# Patient Record
Sex: Female | Born: 1950 | Race: White | Hispanic: No | Marital: Married | Smoking: Former smoker
Health system: Southern US, Community
[De-identification: ages and names within clinical notes are randomized; demographics above are authoritative.]

## PROBLEM LIST (undated history)

## (undated) DIAGNOSIS — R9409 Abnormal results of other function studies of central nervous system: Secondary | ICD-10-CM

## (undated) DIAGNOSIS — M1990 Unspecified osteoarthritis, unspecified site: Secondary | ICD-10-CM

## (undated) DIAGNOSIS — A879 Viral meningitis, unspecified: Secondary | ICD-10-CM

## (undated) DIAGNOSIS — E079 Disorder of thyroid, unspecified: Secondary | ICD-10-CM

## (undated) DIAGNOSIS — E039 Hypothyroidism, unspecified: Secondary | ICD-10-CM

## (undated) DIAGNOSIS — K219 Gastro-esophageal reflux disease without esophagitis: Secondary | ICD-10-CM

## (undated) DIAGNOSIS — B009 Herpesviral infection, unspecified: Secondary | ICD-10-CM

## (undated) HISTORY — PX: APPENDECTOMY: SHX54

## (undated) HISTORY — PX: COLONOSCOPY: SHX174

## (undated) HISTORY — PX: THYROIDECTOMY: SHX17

## (undated) HISTORY — PX: ESOPHAGOGASTRODUODENOSCOPY: SHX1529

---

## 2009-01-14 DIAGNOSIS — A879 Viral meningitis, unspecified: Secondary | ICD-10-CM

## 2009-01-14 HISTORY — DX: Viral meningitis, unspecified: A87.9

## 2013-11-16 ENCOUNTER — Emergency Department (HOSPITAL_BASED_OUTPATIENT_CLINIC_OR_DEPARTMENT_OTHER)
Admission: EM | Admit: 2013-11-16 | Discharge: 2013-11-16 | Disposition: A | Discharge status: Home / Self Care | Payer: BC Managed Care – PPO | Attending: Emergency Medicine | Admitting: Emergency Medicine

## 2013-11-16 ENCOUNTER — Encounter (HOSPITAL_BASED_OUTPATIENT_CLINIC_OR_DEPARTMENT_OTHER): Payer: Self-pay

## 2013-11-16 DIAGNOSIS — Z91040 Latex allergy status: Secondary | ICD-10-CM | POA: Insufficient documentation

## 2013-11-16 DIAGNOSIS — R140 Abdominal distension (gaseous): Secondary | ICD-10-CM

## 2013-11-16 DIAGNOSIS — Z9889 Other specified postprocedural states: Secondary | ICD-10-CM | POA: Diagnosis not present

## 2013-11-16 DIAGNOSIS — E079 Disorder of thyroid, unspecified: Secondary | ICD-10-CM | POA: Insufficient documentation

## 2013-11-16 DIAGNOSIS — K921 Melena: Secondary | ICD-10-CM | POA: Diagnosis not present

## 2013-11-16 DIAGNOSIS — K219 Gastro-esophageal reflux disease without esophagitis: Secondary | ICD-10-CM | POA: Diagnosis not present

## 2013-11-16 DIAGNOSIS — K625 Hemorrhage of anus and rectum: Secondary | ICD-10-CM | POA: Diagnosis present

## 2013-11-16 DIAGNOSIS — Z9089 Acquired absence of other organs: Secondary | ICD-10-CM | POA: Insufficient documentation

## 2013-11-16 DIAGNOSIS — M549 Dorsalgia, unspecified: Secondary | ICD-10-CM | POA: Insufficient documentation

## 2013-11-16 HISTORY — DX: Gastro-esophageal reflux disease without esophagitis: K21.9

## 2013-11-16 HISTORY — DX: Disorder of thyroid, unspecified: E07.9

## 2013-11-16 HISTORY — DX: Abnormal results of other function studies of central nervous system: R94.09

## 2013-11-16 LAB — CBC WITH DIFFERENTIAL/PLATELET
Basophils Absolute: 0.1 10*3/uL (ref 0.0–0.1)
Basophils Relative: 1 % (ref 0–1)
EOS PCT: 0 % (ref 0–5)
Eosinophils Absolute: 0 10*3/uL (ref 0.0–0.7)
HCT: 41.1 % (ref 36.0–46.0)
Hemoglobin: 14.1 g/dL (ref 12.0–15.0)
Lymphocytes Relative: 37 % (ref 12–46)
Lymphs Abs: 1.9 10*3/uL (ref 0.7–4.0)
MCH: 29.7 pg (ref 26.0–34.0)
MCHC: 34.3 g/dL (ref 30.0–36.0)
MCV: 86.7 fL (ref 78.0–100.0)
MONO ABS: 0.9 10*3/uL (ref 0.1–1.0)
Monocytes Relative: 17 % — ABNORMAL HIGH (ref 3–12)
NEUTROS PCT: 45 % (ref 43–77)
Neutro Abs: 2.1 10*3/uL (ref 1.7–7.7)
Platelets: 192 10*3/uL (ref 150–400)
RBC: 4.74 MIL/uL (ref 3.87–5.11)
RDW: 13.6 % (ref 11.5–15.5)
WBC: 5 10*3/uL (ref 4.0–10.5)

## 2013-11-16 LAB — BASIC METABOLIC PANEL
Anion gap: 12 (ref 5–15)
BUN: 17 mg/dL (ref 6–23)
CHLORIDE: 102 meq/L (ref 96–112)
CO2: 28 mEq/L (ref 19–32)
Calcium: 10 mg/dL (ref 8.4–10.5)
Creatinine, Ser: 0.8 mg/dL (ref 0.50–1.10)
GFR, EST NON AFRICAN AMERICAN: 78 mL/min — AB (ref >90)
Glucose, Bld: 97 mg/dL (ref 70–99)
POTASSIUM: 4.3 meq/L (ref 3.7–5.3)
Sodium: 142 mEq/L (ref 137–147)

## 2013-11-16 LAB — OCCULT BLOOD X 1 CARD TO LAB, STOOL: Fecal Occult Bld: NEGATIVE

## 2013-11-16 NOTE — Discharge Instructions (Signed)
Your test for blood in your stool is negative. Your blood work does not show signs of infection and no anemia. Stay on a bland diet for the next few days and follow up with your doctor. Return here as needed for worsening symptoms.

## 2013-11-16 NOTE — ED Notes (Signed)
2 day hx diffuse abdominal pain and dark, tarry stools.

## 2013-11-16 NOTE — ED Provider Notes (Signed)
CSN: [id number]     Arrival date & time 11/16/13  1243 History   First MD Initiated Contact with Patient 11/16/13 1337     Chief Complaint  Patient presents with  . Rectal Bleeding     (Consider location/radiation/quality/duration/timing/severity/associated sxs/prior Treatment) Patient is a 63 y.o. female presenting with hematochezia. The history is provided by the patient.  Rectal Bleeding Quality:  Black and tarry Duration:  2 days Timing:  Intermittent Progression:  Worsening Chronicity:  New Context: not constipation, not diarrhea, not hemorrhoids and not rectal pain   Similar prior episodes: no   Relieved by:  None tried Worsened by:  Nothing tried Ineffective treatments:  None tried Associated symptoms: abdominal pain   Associated symptoms: no fever and no vomiting    Terri Thomas is a 63 y.o. female who presents to the ED with soft stools that have gotten darker over the past 2 days with abdominal bloating. She rates the pain as 1/10. She had a colonoscopy 3 years ago and endoscopy one year ago.   Past Medical History  Diagnosis Date  . GERD (gastroesophageal reflux disease)   . Other abnormality of brain or central nervous system function study     Goblet cell metaplasia  . Thyroid disease    Past Surgical History  Procedure Laterality Date  . Appendectomy    . Thyroidectomy    . Colonoscopy    . Esophagogastroduodenoscopy     No family history on file. History  Substance Use Topics  . Smoking status: Never Smoker   . Smokeless tobacco: Not on file  . Alcohol Use: 0.6 oz/week    1 Glasses of wine per week     Comment: daily   OB History    No data available     Review of Systems  Constitutional: Negative for fever and chills.  HENT: Negative.   Eyes: Negative for pain, redness, itching and visual disturbance.  Respiratory: Negative for cough, shortness of breath and wheezing.   Cardiovascular: Negative for chest pain and palpitations.   Gastrointestinal: Positive for abdominal pain and hematochezia. Negative for vomiting, diarrhea and constipation.  Genitourinary: Negative for dysuria, urgency, frequency, hematuria, flank pain, vaginal bleeding and vaginal discharge.  Musculoskeletal: Positive for back pain. Negative for myalgias.  Skin: Negative for rash.  Allergic/Immunologic: Negative for environmental allergies and food allergies.  Neurological: Negative for syncope and headaches.  Psychiatric/Behavioral: The patient is not nervous/anxious.       Allergies  Contrast media; Erythromycin; Latex; and Shellfish allergy  Home Medications   Prior to Admission medications   Medication Sig Start Date End Date Taking? Authorizing Provider  Levothyroxine Sodium (SYNTHROID PO) Take by mouth.   Yes Historical Provider, MD  Omeprazole (PRILOSEC PO) Take by mouth.   Yes Historical Provider, MD   BP 119/70 mmHg  Pulse 60  Temp(Src) 98.1 F (36.7 C) (Oral)  Resp 18  Ht 5\' 8"  (1.727 m)  Wt 195 lb (88.451 kg)  BMI 29.66 kg/m2  SpO2 99% Physical Exam  Constitutional: She is oriented to person, place, and time. She appears well-developed and well-nourished.  HENT:  Head: Normocephalic and atraumatic.  Eyes: EOM are normal.  Neck: Neck supple.  Cardiovascular: Normal rate.   Pulmonary/Chest: Effort normal.  Abdominal: Soft. Bowel sounds are normal. There is no tenderness.  Genitourinary: Rectal exam shows no external hemorrhoid, no internal hemorrhoid, no fissure, no mass, no tenderness and anal tone normal. Guaiac negative stool.  Soft black stool that is guaiac  negative.   Musculoskeletal: Normal range of motion.  Neurological: She is alert and oriented to person, place, and time. No cranial nerve deficit.  Skin: Skin is warm and dry.  Psychiatric: She has a normal mood and affect. Her behavior is normal.  Nursing note and vitals reviewed.   ED Course  Procedures (including critical care time) Labs  Review Results for orders placed or performed during the hospital encounter of 11/16/13 (from the past 24 hour(s))  Occult blood card to lab, stool Provider will collect     Status: None   Collection Time: 11/16/13  2:00 PM  Result Value Ref Range   Fecal Occult Bld NEGATIVE NEGATIVE  CBC with Differential     Status: Abnormal   Collection Time: 11/16/13  2:05 PM  Result Value Ref Range   WBC 5.0 4.0 - 10.5 K/uL   RBC 4.74 3.87 - 5.11 MIL/uL   Hemoglobin 14.1 12.0 - 15.0 g/dL   HCT 41.1 36.0 - 46.0 %   MCV 86.7 78.0 - 100.0 fL   MCH 29.7 26.0 - 34.0 pg   MCHC 34.3 30.0 - 36.0 g/dL   RDW 13.6 11.5 - 15.5 %   Platelets 192 150 - 400 K/uL   Neutrophils Relative % 45 43 - 77 %   Lymphocytes Relative 37 12 - 46 %   Monocytes Relative 17 (H) 3 - 12 %   Eosinophils Relative 0 0 - 5 %   Basophils Relative 1 0 - 1 %   Neutro Abs 2.1 1.7 - 7.7 K/uL   Lymphs Abs 1.9 0.7 - 4.0 K/uL   Monocytes Absolute 0.9 0.1 - 1.0 K/uL   Eosinophils Absolute 0.0 0.0 - 0.7 K/uL   Basophils Absolute 0.1 0.0 - 0.1 K/uL   RBC Morphology STOMATOCYTES    WBC Morphology WHITE COUNT CONFIRMED ON SMEAR    Smear Review PLATELET COUNT CONFIRMED BY SMEAR   Basic metabolic panel     Status: Abnormal   Collection Time: 11/16/13  2:05 PM  Result Value Ref Range   Sodium 142 137 - 147 mEq/L   Potassium 4.3 3.7 - 5.3 mEq/L   Chloride 102 96 - 112 mEq/L   CO2 28 19 - 32 mEq/L   Glucose, Bld 97 70 - 99 mg/dL   BUN 17 6 - 23 mg/dL   Creatinine, Ser 0.80 0.50 - 1.10 mg/dL   Calcium 10.0 8.4 - 10.5 mg/dL   GFR calc non Af Amer 78 (L) >90 mL/min   GFR calc Af Amer >90 >90 mL/min   Anion gap 12 5 - 15     MDM  63 y.o. female with black stools x 2 days that concerned her for possible blood in her stools. I have reviewed this patient's vital signs, nurses notes, appropriate labs and discussed findings with the patient and plan of care. She voices understanding and agrees with plan. She will follow up with her PCP. She  will return as needed. Stable for discharge without blood identified in stool. [facility] [address] [place], [place] 11/18/13 [place], MD 11/20/13 [phone]

## 2014-10-17 ENCOUNTER — Other Ambulatory Visit (HOSPITAL_COMMUNITY): Payer: Self-pay | Admitting: Chiropractic Medicine

## 2014-10-17 ENCOUNTER — Ambulatory Visit (HOSPITAL_COMMUNITY)
Admission: RE | Admit: 2014-10-17 | Discharge: 2014-10-17 | Disposition: A | Discharge status: Home / Self Care | Payer: 59 | Source: Ambulatory Visit | Attending: Chiropractic Medicine | Admitting: Chiropractic Medicine

## 2014-10-17 DIAGNOSIS — M25551 Pain in right hip: Secondary | ICD-10-CM | POA: Diagnosis present

## 2014-10-17 DIAGNOSIS — M461 Sacroiliitis, not elsewhere classified: Secondary | ICD-10-CM | POA: Insufficient documentation

## 2015-03-20 DIAGNOSIS — K2100 Gastro-esophageal reflux disease with esophagitis, without bleeding: Secondary | ICD-10-CM | POA: Diagnosis present

## 2015-03-21 DIAGNOSIS — K648 Other hemorrhoids: Secondary | ICD-10-CM | POA: Diagnosis present

## 2015-06-19 DIAGNOSIS — B009 Herpesviral infection, unspecified: Secondary | ICD-10-CM | POA: Diagnosis present

## 2015-11-08 DIAGNOSIS — E890 Postprocedural hypothyroidism: Secondary | ICD-10-CM | POA: Diagnosis present

## 2015-12-15 HISTORY — PX: MENISCUS REPAIR: SHX5179

## 2016-09-02 DIAGNOSIS — M1611 Unilateral primary osteoarthritis, right hip: Secondary | ICD-10-CM | POA: Diagnosis present

## 2016-09-02 NOTE — H&P (Signed)
PREOPERATIVE H&P Patient ID: Terri Thomas MRN: [id number] DOB/AGE: Jul 06, 1950 66 y.o.  Chief Complaint: OA RIGHT HIP  Planned Procedure Date: 10/01/16 Medical and Cardiac Clearance by Antonieta Pert, FNP    HPI: Terri Thomas is a 66 y.o. female with a history of GERD, and post-surgical hypothyroidism who presents for evaluation of OA RIGHT HIP. The patient has a history of pain and functional disability in the right hip due to arthritis and has failed non-surgical conservative treatments for greater than 12 weeks to include NSAID's and/or analgesics, weight reduction as appropriate and activity modification.  Onset of symptoms was gradual, starting 5 years ago with gradually worsening course since that time. The patient noted no past surgery on the right hip.  Patient currently rates pain at 8 out of 10 with activity. Patient has night pain, worsening of pain with activity and weight bearing, pain that interferes with activities of daily living and pain with passive range of motion.  Patient has evidence of subchondral cysts, subchondral sclerosis, periarticular osteophytes and joint space narrowing by imaging studies.  There is no active infection.  Past Medical History:  Diagnosis Date  . GERD (gastroesophageal reflux disease)   . Other abnormality of brain or central nervous system function study    Goblet cell metaplasia  . Thyroid disease    Past Surgical History:  Procedure Laterality Date  . APPENDECTOMY    . COLONOSCOPY    . ESOPHAGOGASTRODUODENOSCOPY    . THYROIDECTOMY     Allergies  Allergen Reactions  . Contrast Media [Iodinated Diagnostic Agents]   . Erythromycin   . Latex   . Shellfish Allergy     Medications: Synthroid 170mcg daily Valcyclovir 500 mg daily  Ranitadine 150 mg BID Multivitamin daily  Social History: Married.  Former smoker, quit 1990 (1ppd).  1 glass wine daily.  Planning to  retire from [facility] without return postoperatively.   Family  History: Mother with Htn, DM, CVA.  Father with Cancer.  Brother with DM.  Grandparents with CV dz, DM, CVA.   ROS: Currently denies lightheadedness, dizziness, Fever, chills, CP, SOB.   No personal history of DVT, PE, MI, or CVA. No loose teeth or dentures All other systems have been reviewed and were otherwise currently negative with the exception of those mentioned in the HPI and as above.  Objective: Vitals: Ht: 5'8" Wt: 195 Temp: 97.9 BP: 135/81 Pulse: 63 O2 99% on room air. Physical Exam: General: Alert, NAD. Trendelenberg Gait  HEENT: EOMI, Good Neck Extension  Pulm: No increased work of breathing.  Clear B/L A/P w/o crackle or wheeze.  CV: RRR, No m/g/r appreciated  GI: soft, NT, ND Neuro: Neuro without gross focal deficit.  Sensation intact distally Skin: No lesions in the area of chief complaint MSK/Surgical Site: Right Hip Pain with passive ROM.  Positive Stinchfield.  5/5 strength.  NVI.  Sensation intact distally.  Imaging Review Plain radiographs demonstrate severe degenerative joint disease of the right hip.   Assessment: OA RIGHT HIP Principal Problem:   Primary osteoarthritis of right hip Active Problems:   Gastroesophageal reflux disease with esophagitis   Herpes simplex type 2 infection   Internal hemorrhoids   Postoperative hypothyroidism   Plan: Plan for Procedure(s): TOTAL HIP ARTHROPLASTY ANTERIOR APPROACH  The patient history, physical exam, clinical judgement of the provider and imaging are consistent with end stage degenerative joint disease and total joint arthroplasty is deemed medically necessary. The treatment options including medical management, injection therapy, and arthroplasty were discussed  at length. The risks and benefits of Procedure(s): TOTAL HIP ARTHROPLASTY ANTERIOR APPROACH were presented and reviewed.  The risks of nonoperative treatment, versus surgical intervention including but not limited to continued pain, aseptic loosening,  stiffness, dislocation/subluxation, infection, bleeding, nerve injury, blood clots, cardiopulmonary complications, morbidity, mortality, among others were discussed. The patient verbalizes understanding and wishes to proceed with the plan.  Patient is being admitted for inpatient treatment for surgery, pain control, PT, OT, prophylactic antibiotics, VTE prophylaxis, progressive ambulation, ADL's and discharge planning.   Dental prophylaxis discussed and recommended for 2 years postoperatively.   The patient does meet the criteria for TXA which will be used perioperatively via IV.    ASA 325 mg will be used postoperatively for DVT prophylaxis in addition to SCDs, and early ambulation.  The patient is planning to be discharged home with home health services ([facility]) in care of her husband Terri Thomas.  Prudencio Burly III, PA-C 09/02/2016 4:07 PM

## 2016-09-17 ENCOUNTER — Encounter (HOSPITAL_COMMUNITY): Payer: Self-pay

## 2016-09-17 ENCOUNTER — Encounter (HOSPITAL_COMMUNITY)
Admission: RE | Admit: 2016-09-17 | Discharge: 2016-09-17 | Disposition: A | Discharge status: Home / Self Care | Payer: 59 | Source: Ambulatory Visit | Attending: Orthopedic Surgery | Admitting: Orthopedic Surgery

## 2016-09-17 DIAGNOSIS — Z833 Family history of diabetes mellitus: Secondary | ICD-10-CM | POA: Insufficient documentation

## 2016-09-17 DIAGNOSIS — Z91040 Latex allergy status: Secondary | ICD-10-CM | POA: Diagnosis not present

## 2016-09-17 DIAGNOSIS — M1611 Unilateral primary osteoarthritis, right hip: Secondary | ICD-10-CM | POA: Insufficient documentation

## 2016-09-17 DIAGNOSIS — E890 Postprocedural hypothyroidism: Secondary | ICD-10-CM | POA: Insufficient documentation

## 2016-09-17 DIAGNOSIS — K219 Gastro-esophageal reflux disease without esophagitis: Secondary | ICD-10-CM | POA: Insufficient documentation

## 2016-09-17 DIAGNOSIS — Z9889 Other specified postprocedural states: Secondary | ICD-10-CM | POA: Diagnosis not present

## 2016-09-17 DIAGNOSIS — Z01812 Encounter for preprocedural laboratory examination: Secondary | ICD-10-CM | POA: Insufficient documentation

## 2016-09-17 DIAGNOSIS — Z91013 Allergy to seafood: Secondary | ICD-10-CM | POA: Insufficient documentation

## 2016-09-17 DIAGNOSIS — Z8249 Family history of ischemic heart disease and other diseases of the circulatory system: Secondary | ICD-10-CM | POA: Diagnosis not present

## 2016-09-17 DIAGNOSIS — Z809 Family history of malignant neoplasm, unspecified: Secondary | ICD-10-CM | POA: Insufficient documentation

## 2016-09-17 DIAGNOSIS — Z91041 Radiographic dye allergy status: Secondary | ICD-10-CM | POA: Insufficient documentation

## 2016-09-17 DIAGNOSIS — Z79899 Other long term (current) drug therapy: Secondary | ICD-10-CM | POA: Insufficient documentation

## 2016-09-17 DIAGNOSIS — Z888 Allergy status to other drugs, medicaments and biological substances status: Secondary | ICD-10-CM | POA: Insufficient documentation

## 2016-09-17 DIAGNOSIS — Z87891 Personal history of nicotine dependence: Secondary | ICD-10-CM | POA: Diagnosis not present

## 2016-09-17 DIAGNOSIS — Z823 Family history of stroke: Secondary | ICD-10-CM | POA: Diagnosis not present

## 2016-09-17 HISTORY — DX: Viral meningitis, unspecified: A87.9

## 2016-09-17 HISTORY — DX: Hypothyroidism, unspecified: E03.9

## 2016-09-17 HISTORY — DX: Herpesviral infection, unspecified: B00.9

## 2016-09-17 HISTORY — DX: Unspecified osteoarthritis, unspecified site: M19.90

## 2016-09-17 LAB — SURGICAL PCR SCREEN
MRSA, PCR: NEGATIVE
STAPHYLOCOCCUS AUREUS: NEGATIVE

## 2016-09-17 LAB — CBC
HEMATOCRIT: 38.7 % (ref 36.0–46.0)
Hemoglobin: 13.1 g/dL (ref 12.0–15.0)
MCH: 29.2 pg (ref 26.0–34.0)
MCHC: 33.9 g/dL (ref 30.0–36.0)
MCV: 86.2 fL (ref 78.0–100.0)
PLATELETS: 149 10*3/uL — AB (ref 150–400)
RBC: 4.49 MIL/uL (ref 3.87–5.11)
RDW: 13.6 % (ref 11.5–15.5)
WBC: 3.3 10*3/uL — ABNORMAL LOW (ref 4.0–10.5)

## 2016-09-17 LAB — BASIC METABOLIC PANEL
Anion gap: 11 (ref 5–15)
BUN: 25 mg/dL — AB (ref 6–20)
CO2: 23 mmol/L (ref 22–32)
Calcium: 9.7 mg/dL (ref 8.9–10.3)
Chloride: 107 mmol/L (ref 101–111)
Creatinine, Ser: 1.09 mg/dL — ABNORMAL HIGH (ref 0.44–1.00)
GFR calc Af Amer: >60 mL/min (ref >60)
GFR, EST NON AFRICAN AMERICAN: 52 mL/min — AB (ref >60)
GLUCOSE: 114 mg/dL — AB (ref 65–99)
POTASSIUM: 4 mmol/L (ref 3.5–5.1)
SODIUM: 141 mmol/L (ref 135–145)

## 2016-09-17 NOTE — Pre-Procedure Instructions (Signed)
IVETT LUEBBE  09/17/2016      [id number] - [address], [place] [address] Phone: [phone] Fax: [phone]    Your procedure is scheduled on October 01, 2016.  Report to [facility] Admitting at 5:30 A.M.  Call this number if you have problems the morning of surgery:  [phone]   Call [phone] if you have any questions prior to your surgery date Monday-Friday 8am-4pm   Remember:  Do not eat food or drink liquids after midnight.   Take these medicines the morning of surgery with A SIP OF WATER Tylenol if needed, Levothyroxine (Synthroid), Ranitidine (Zantac), Valacyclovir (Valtrex)  7 days prior to surgery STOP taking any Aspirin, Aleve, Naproxen, Ibuprofen, Motrin, Advil, Goody's, BC's, all herbal medications, fish oil, and all vitamins    Do not wear jewelry, make-up or nail polish.  Do not wear lotions, powders, or perfumes, or deodorant.  Do not shave 48 hours prior to surgery.    Do not bring valuables to the hospital.  [facility] is not responsible for any belongings or valuables.  Contacts, dentures or bridgework may not be worn into surgery.  Leave your suitcase in the car.  After surgery it may be brought to your room.  For patients admitted to the hospital, discharge time will be determined by your treatment team.  Patients discharged the day of surgery will not be allowed to drive home.   Special instructions:   Index- Preparing For Surgery  Before surgery, you can play an important role. Because skin is not sterile, your skin needs to be as free of germs as possible. You can reduce the number of germs on your skin by washing with CHG (chlorahexidine gluconate) Soap before surgery.  CHG is an antiseptic cleaner which kills germs and bonds with the skin to continue killing germs even after washing.  Please do not use if you have an allergy to CHG or antibacterial soaps. If  your skin becomes reddened/irritated stop using the CHG.  Do not shave (including legs and underarms) for at least 48 hours prior to first CHG shower. It is OK to shave your face.  Please follow these instructions carefully.   1. Shower the NIGHT BEFORE SURGERY and the MORNING OF SURGERY with CHG.   2. If you chose to wash your hair, wash your hair first as usual with your normal shampoo.  3. After you shampoo, rinse your hair and body thoroughly to remove the shampoo.  4. Use CHG as you would any other liquid soap. You can apply CHG directly to the skin and wash gently with a scrungie or a clean washcloth.   5. Apply the CHG Soap to your body ONLY FROM THE NECK DOWN.  Do not use on open wounds or open sores. Avoid contact with your eyes, ears, mouth and genitals (private parts). Wash genitals (private parts) with your normal soap.  6. Wash thoroughly, paying special attention to the area where your surgery will be performed.  7. Thoroughly rinse your body with warm water from the neck down.  8. DO NOT shower/wash with your normal soap after using and rinsing off the CHG Soap.  9. Pat yourself dry with a CLEAN TOWEL.   10. Wear CLEAN PAJAMAS   11. Place CLEAN SHEETS on your bed the night of your first shower and DO NOT SLEEP WITH PETS.    Day of Surgery: Do not apply any  deodorants/lotions. Please wear clean clothes to the hospital/surgery center.      Please read over the following fact sheets that you were given. Coughing and Deep Breathing, MRSA Information and Surgical Site Infection Prevention

## 2016-09-17 NOTE — Progress Notes (Signed)
PCP: Antonieta Pert FNP-[facility]  Cardiologist: Denies  EKG: 07/31/16 in care everywhere, requested from [facility] CXR, ECHO, Stress Test, Cardiac Cath: Denies  Patient denies shortness of breath, fever, cough, and chest pain at PAT appointment.  Patient verbalized understanding of instructions provided today at the PAT appointment.  Patient asked to review instructions at home and day of surgery.

## 2016-09-20 ENCOUNTER — Other Ambulatory Visit (HOSPITAL_COMMUNITY): Payer: 59

## 2016-09-30 MED ORDER — TRANEXAMIC ACID 1000 MG/10ML IV SOLN
2000.0000 mg | Freq: Once | INTRAVENOUS | Status: AC
  Administered 2016-10-01: 2000 mg via TOPICAL
  Filled 2016-09-30: qty 20

## 2016-09-30 MED ORDER — TRANEXAMIC ACID 1000 MG/10ML IV SOLN
1000.0000 mg | INTRAVENOUS | Status: AC
  Administered 2016-10-01: 1000 mg via INTRAVENOUS
  Filled 2016-09-30: qty 10

## 2016-10-01 ENCOUNTER — Inpatient Hospital Stay (HOSPITAL_COMMUNITY): Payer: 59 | Admitting: Anesthesiology

## 2016-10-01 ENCOUNTER — Inpatient Hospital Stay (HOSPITAL_COMMUNITY): Payer: 59

## 2016-10-01 ENCOUNTER — Encounter (HOSPITAL_COMMUNITY)
Admission: RE | Disposition: A | Discharge status: Home Health | Payer: Self-pay | Source: Ambulatory Visit | Attending: Orthopedic Surgery

## 2016-10-01 ENCOUNTER — Encounter (HOSPITAL_COMMUNITY): Payer: Self-pay | Admitting: General Practice

## 2016-10-01 ENCOUNTER — Inpatient Hospital Stay (HOSPITAL_COMMUNITY)
Admission: RE | Admit: 2016-10-01 | Discharge: 2016-10-02 | DRG: 470 | Disposition: A | Discharge status: Home Health | Payer: 59 | Source: Ambulatory Visit | Attending: Orthopedic Surgery | Admitting: Orthopedic Surgery

## 2016-10-01 DIAGNOSIS — K2100 Gastro-esophageal reflux disease with esophagitis, without bleeding: Secondary | ICD-10-CM | POA: Diagnosis present

## 2016-10-01 DIAGNOSIS — Z87891 Personal history of nicotine dependence: Secondary | ICD-10-CM

## 2016-10-01 DIAGNOSIS — Z6829 Body mass index (BMI) 29.0-29.9, adult: Secondary | ICD-10-CM | POA: Diagnosis not present

## 2016-10-01 DIAGNOSIS — Z79899 Other long term (current) drug therapy: Secondary | ICD-10-CM | POA: Diagnosis not present

## 2016-10-01 DIAGNOSIS — B009 Herpesviral infection, unspecified: Secondary | ICD-10-CM | POA: Diagnosis present

## 2016-10-01 DIAGNOSIS — E669 Obesity, unspecified: Secondary | ICD-10-CM | POA: Diagnosis present

## 2016-10-01 DIAGNOSIS — K648 Other hemorrhoids: Secondary | ICD-10-CM | POA: Diagnosis present

## 2016-10-01 DIAGNOSIS — Z91040 Latex allergy status: Secondary | ICD-10-CM | POA: Diagnosis not present

## 2016-10-01 DIAGNOSIS — Z91041 Radiographic dye allergy status: Secondary | ICD-10-CM

## 2016-10-01 DIAGNOSIS — M1611 Unilateral primary osteoarthritis, right hip: Principal | ICD-10-CM | POA: Diagnosis present

## 2016-10-01 DIAGNOSIS — Z881 Allergy status to other antibiotic agents status: Secondary | ICD-10-CM | POA: Diagnosis not present

## 2016-10-01 DIAGNOSIS — Z8661 Personal history of infections of the central nervous system: Secondary | ICD-10-CM

## 2016-10-01 DIAGNOSIS — E890 Postprocedural hypothyroidism: Secondary | ICD-10-CM | POA: Diagnosis present

## 2016-10-01 DIAGNOSIS — Z419 Encounter for procedure for purposes other than remedying health state, unspecified: Secondary | ICD-10-CM

## 2016-10-01 HISTORY — PX: TOTAL HIP ARTHROPLASTY: SHX124

## 2016-10-01 SURGERY — ARTHROPLASTY, HIP, TOTAL, ANTERIOR APPROACH
Anesthesia: Spinal | Site: Hip | Laterality: Right

## 2016-10-01 MED ORDER — KETOROLAC TROMETHAMINE 30 MG/ML IJ SOLN
INTRAMUSCULAR | Status: AC
  Filled 2016-10-01: qty 3

## 2016-10-01 MED ORDER — MENTHOL 3 MG MT LOZG: 1.0000 | LOZENGE | OROMUCOSAL | Status: DC | PRN

## 2016-10-01 MED ORDER — PHENYLEPHRINE 40 MCG/ML (10ML) SYRINGE FOR IV PUSH (FOR BLOOD PRESSURE SUPPORT)
PREFILLED_SYRINGE | INTRAVENOUS | Status: AC
  Filled 2016-10-01: qty 10

## 2016-10-01 MED ORDER — ASPIRIN EC 325 MG PO TBEC
325.0000 mg | DELAYED_RELEASE_TABLET | Freq: Every day | ORAL | Status: DC
  Administered 2016-10-02: 325 mg via ORAL
  Filled 2016-10-01: qty 1

## 2016-10-01 MED ORDER — LEVOTHYROXINE SODIUM 25 MCG PO TABS
137.0000 ug | ORAL_TABLET | Freq: Every day | ORAL | Status: DC
  Administered 2016-10-02: 137 ug via ORAL
  Filled 2016-10-01: qty 1

## 2016-10-01 MED ORDER — CELECOXIB 200 MG PO CAPS
200.0000 mg | ORAL_CAPSULE | Freq: Two times a day (BID) | ORAL | Status: DC
  Administered 2016-10-01 – 2016-10-02 (×3): 200 mg via ORAL
  Filled 2016-10-01 (×3): qty 1

## 2016-10-01 MED ORDER — CELECOXIB 200 MG PO CAPS
200.0000 mg | ORAL_CAPSULE | Freq: Two times a day (BID) | ORAL | 0 refills | Status: AC
Start: 2016-10-01 — End: 2017-10-01

## 2016-10-01 MED ORDER — ASPIRIN EC 325 MG PO TBEC: 325.0000 mg | DELAYED_RELEASE_TABLET | Freq: Every day | ORAL | 0 refills | Status: AC

## 2016-10-01 MED ORDER — DIPHENHYDRAMINE HCL 12.5 MG/5ML PO ELIX: 12.5000 mg | ORAL_SOLUTION | ORAL | Status: DC | PRN

## 2016-10-01 MED ORDER — METHOCARBAMOL 500 MG PO TABS: 500.0000 mg | ORAL_TABLET | Freq: Four times a day (QID) | ORAL | 0 refills | Status: AC | PRN

## 2016-10-01 MED ORDER — ACETAMINOPHEN 500 MG PO TABS
1000.0000 mg | ORAL_TABLET | Freq: Once | ORAL | Status: DC
  Filled 2016-10-01: qty 2

## 2016-10-01 MED ORDER — DEXAMETHASONE SODIUM PHOSPHATE 10 MG/ML IJ SOLN
10.0000 mg | Freq: Once | INTRAMUSCULAR | Status: AC
  Administered 2016-10-02: 10 mg via INTRAVENOUS
  Filled 2016-10-01: qty 1

## 2016-10-01 MED ORDER — FAMOTIDINE 20 MG PO TABS
20.0000 mg | ORAL_TABLET | Freq: Every day | ORAL | Status: DC
  Administered 2016-10-02: 20 mg via ORAL
  Filled 2016-10-01: qty 1

## 2016-10-01 MED ORDER — METHOCARBAMOL 500 MG PO TABS
500.0000 mg | ORAL_TABLET | Freq: Four times a day (QID) | ORAL | Status: DC | PRN
  Administered 2016-10-01 – 2016-10-02 (×3): 500 mg via ORAL
  Filled 2016-10-01 (×2): qty 1

## 2016-10-01 MED ORDER — MIDAZOLAM HCL 5 MG/5ML IJ SOLN
INTRAMUSCULAR | Status: DC | PRN
  Administered 2016-10-01: 2 mg via INTRAVENOUS

## 2016-10-01 MED ORDER — PROPOFOL 10 MG/ML IV BOLUS
INTRAVENOUS | Status: AC
  Filled 2016-10-01: qty 40

## 2016-10-01 MED ORDER — BUPIVACAINE IN DEXTROSE 0.75-8.25 % IT SOLN
INTRATHECAL | Status: DC | PRN
  Administered 2016-10-01: 2 mL via INTRATHECAL

## 2016-10-01 MED ORDER — HYDROCODONE-ACETAMINOPHEN 5-325 MG PO TABS: 1.0000 | ORAL_TABLET | ORAL | 0 refills | Status: AC | PRN

## 2016-10-01 MED ORDER — FLEET ENEMA 7-19 GM/118ML RE ENEM: 1.0000 | ENEMA | Freq: Once | RECTAL | Status: DC | PRN

## 2016-10-01 MED ORDER — DOCUSATE SODIUM 100 MG PO CAPS
100.0000 mg | ORAL_CAPSULE | Freq: Two times a day (BID) | ORAL | Status: DC
  Administered 2016-10-01 – 2016-10-02 (×3): 100 mg via ORAL
  Filled 2016-10-01 (×3): qty 1

## 2016-10-01 MED ORDER — BUPIVACAINE-EPINEPHRINE 0.25% -1:200000 IJ SOLN
INTRAMUSCULAR | Status: DC | PRN
  Administered 2016-10-01: 30 mL

## 2016-10-01 MED ORDER — SODIUM CHLORIDE FLUSH 0.9 % IV SOLN
INTRAVENOUS | Status: DC | PRN
  Administered 2016-10-01 (×3): 10 mL

## 2016-10-01 MED ORDER — LACTATED RINGERS IV SOLN
INTRAVENOUS | Status: DC
  Administered 2016-10-01 (×2): via INTRAVENOUS

## 2016-10-01 MED ORDER — PROPOFOL 10 MG/ML IV BOLUS
INTRAVENOUS | Status: DC | PRN
  Administered 2016-10-01: 20 mg via INTRAVENOUS

## 2016-10-01 MED ORDER — FENTANYL CITRATE (PF) 100 MCG/2ML IJ SOLN
INTRAMUSCULAR | Status: AC
  Administered 2016-10-01: 50 ug via INTRAVENOUS
  Filled 2016-10-01: qty 2

## 2016-10-01 MED ORDER — ACETAMINOPHEN 325 MG PO TABS: 650.0000 mg | ORAL_TABLET | Freq: Four times a day (QID) | ORAL | Status: DC | PRN

## 2016-10-01 MED ORDER — FENTANYL CITRATE (PF) 100 MCG/2ML IJ SOLN
INTRAMUSCULAR | Status: DC | PRN
  Administered 2016-10-01: 100 ug via INTRAVENOUS

## 2016-10-01 MED ORDER — HYDROMORPHONE HCL 1 MG/ML IJ SOLN
0.5000 mg | INTRAMUSCULAR | Status: DC | PRN
  Administered 2016-10-01 – 2016-10-02 (×2): 1 mg via INTRAVENOUS
  Filled 2016-10-01 (×2): qty 1

## 2016-10-01 MED ORDER — METHOCARBAMOL 1000 MG/10ML IJ SOLN
500.0000 mg | Freq: Four times a day (QID) | INTRAVENOUS | Status: DC | PRN
  Filled 2016-10-01: qty 5

## 2016-10-01 MED ORDER — FENTANYL CITRATE (PF) 100 MCG/2ML IJ SOLN
25.0000 ug | INTRAMUSCULAR | Status: DC | PRN
  Administered 2016-10-01 (×2): 50 ug via INTRAVENOUS

## 2016-10-01 MED ORDER — CHLORHEXIDINE GLUCONATE 4 % EX LIQD: 60.0000 mL | Freq: Once | CUTANEOUS | Status: DC

## 2016-10-01 MED ORDER — 0.9 % SODIUM CHLORIDE (POUR BTL) OPTIME
TOPICAL | Status: DC | PRN
  Administered 2016-10-01: 1000 mL

## 2016-10-01 MED ORDER — METOCLOPRAMIDE HCL 5 MG/ML IJ SOLN: 5.0000 mg | Freq: Three times a day (TID) | INTRAMUSCULAR | Status: DC | PRN

## 2016-10-01 MED ORDER — POLYETHYLENE GLYCOL 3350 17 G PO PACK: 17.0000 g | PACK | Freq: Every day | ORAL | Status: DC | PRN

## 2016-10-01 MED ORDER — PHENYLEPHRINE HCL 10 MG/ML IJ SOLN
INTRAMUSCULAR | Status: DC | PRN
  Administered 2016-10-01: 120 ug via INTRAVENOUS
  Administered 2016-10-01: 80 ug via INTRAVENOUS
  Administered 2016-10-01: 120 ug via INTRAVENOUS
  Administered 2016-10-01: 80 ug via INTRAVENOUS

## 2016-10-01 MED ORDER — METHOCARBAMOL 500 MG PO TABS
ORAL_TABLET | ORAL | Status: AC
  Filled 2016-10-01: qty 1

## 2016-10-01 MED ORDER — PROPOFOL 500 MG/50ML IV EMUL
INTRAVENOUS | Status: DC | PRN
  Administered 2016-10-01: 90 ug/kg/min via INTRAVENOUS

## 2016-10-01 MED ORDER — SUCCINYLCHOLINE CHLORIDE 200 MG/10ML IV SOSY
PREFILLED_SYRINGE | INTRAVENOUS | Status: AC
  Filled 2016-10-01: qty 10

## 2016-10-01 MED ORDER — SORBITOL 70 % SOLN: 30.0000 mL | Freq: Every day | Status: DC | PRN

## 2016-10-01 MED ORDER — BUPIVACAINE-EPINEPHRINE (PF) 0.25% -1:200000 IJ SOLN
INTRAMUSCULAR | Status: AC
  Filled 2016-10-01: qty 30

## 2016-10-01 MED ORDER — ONDANSETRON HCL 4 MG PO TABS: 4.0000 mg | ORAL_TABLET | Freq: Three times a day (TID) | ORAL | 0 refills | Status: AC | PRN

## 2016-10-01 MED ORDER — GABAPENTIN 300 MG PO CAPS
300.0000 mg | ORAL_CAPSULE | Freq: Once | ORAL | Status: AC
  Administered 2016-10-01: 300 mg via ORAL
  Filled 2016-10-01: qty 1

## 2016-10-01 MED ORDER — DOCUSATE SODIUM 100 MG PO CAPS: 100.0000 mg | ORAL_CAPSULE | Freq: Two times a day (BID) | ORAL | 0 refills | Status: DC

## 2016-10-01 MED ORDER — VALACYCLOVIR HCL 500 MG PO TABS: 500.0000 mg | ORAL_TABLET | Freq: Two times a day (BID) | ORAL | Status: DC | PRN

## 2016-10-01 MED ORDER — SENNA 8.6 MG PO TABS
1.0000 | ORAL_TABLET | Freq: Two times a day (BID) | ORAL | Status: DC
  Administered 2016-10-01 – 2016-10-02 (×3): 8.6 mg via ORAL
  Filled 2016-10-01 (×3): qty 1

## 2016-10-01 MED ORDER — LACTATED RINGERS IV SOLN
INTRAVENOUS | Status: DC
  Administered 2016-10-01 (×2): via INTRAVENOUS

## 2016-10-01 MED ORDER — HYDROCODONE-ACETAMINOPHEN 5-325 MG PO TABS
1.0000 | ORAL_TABLET | ORAL | Status: DC | PRN
  Administered 2016-10-01 – 2016-10-02 (×5): 2 via ORAL
  Filled 2016-10-01 (×4): qty 2

## 2016-10-01 MED ORDER — CEFAZOLIN SODIUM-DEXTROSE 2-4 GM/100ML-% IV SOLN
2.0000 g | INTRAVENOUS | Status: AC
  Administered 2016-10-01: 2 g via INTRAVENOUS
  Filled 2016-10-01: qty 100

## 2016-10-01 MED ORDER — ONDANSETRON HCL 4 MG/2ML IJ SOLN
4.0000 mg | Freq: Four times a day (QID) | INTRAMUSCULAR | Status: DC | PRN
  Administered 2016-10-01 – 2016-10-02 (×2): 4 mg via INTRAVENOUS
  Filled 2016-10-01 (×2): qty 2

## 2016-10-01 MED ORDER — ONDANSETRON HCL 4 MG/2ML IJ SOLN: 4.0000 mg | Freq: Once | INTRAMUSCULAR | Status: DC | PRN

## 2016-10-01 MED ORDER — KETOROLAC TROMETHAMINE 30 MG/ML IJ SOLN
INTRAMUSCULAR | Status: DC | PRN
  Administered 2016-10-01: 30 mg via INTRA_ARTICULAR

## 2016-10-01 MED ORDER — LIDOCAINE 2% (20 MG/ML) 5 ML SYRINGE
INTRAMUSCULAR | Status: AC
  Filled 2016-10-01: qty 5

## 2016-10-01 MED ORDER — ONDANSETRON HCL 4 MG PO TABS: 4.0000 mg | ORAL_TABLET | Freq: Four times a day (QID) | ORAL | Status: DC | PRN

## 2016-10-01 MED ORDER — CEFAZOLIN SODIUM-DEXTROSE 2-4 GM/100ML-% IV SOLN
2.0000 g | Freq: Four times a day (QID) | INTRAVENOUS | Status: AC
  Administered 2016-10-01 (×2): 2 g via INTRAVENOUS
  Filled 2016-10-01 (×2): qty 100

## 2016-10-01 MED ORDER — FENTANYL CITRATE (PF) 250 MCG/5ML IJ SOLN
INTRAMUSCULAR | Status: AC
  Filled 2016-10-01: qty 5

## 2016-10-01 MED ORDER — MIDAZOLAM HCL 2 MG/2ML IJ SOLN
INTRAMUSCULAR | Status: AC
  Filled 2016-10-01: qty 2

## 2016-10-01 MED ORDER — HYDROCODONE-ACETAMINOPHEN 5-325 MG PO TABS
ORAL_TABLET | ORAL | Status: AC
  Filled 2016-10-01: qty 2

## 2016-10-01 MED ORDER — METOCLOPRAMIDE HCL 5 MG PO TABS: 5.0000 mg | ORAL_TABLET | Freq: Three times a day (TID) | ORAL | Status: DC | PRN

## 2016-10-01 MED ORDER — ACETAMINOPHEN 650 MG RE SUPP: 650.0000 mg | Freq: Four times a day (QID) | RECTAL | Status: DC | PRN

## 2016-10-01 MED ORDER — PHENOL 1.4 % MT LIQD: 1.0000 | OROMUCOSAL | Status: DC | PRN

## 2016-10-01 SURGICAL SUPPLY — 49 items
BAG DECANTER FOR FLEXI CONT (MISCELLANEOUS) ×2 IMPLANT
BLADE SAG 18X100X1.27 (BLADE) IMPLANT
CAPT HIP TOTAL 3 ×2 IMPLANT
CLSR STERI-STRIP ANTIMIC 1/2X4 (GAUZE/BANDAGES/DRESSINGS) ×2 IMPLANT
COVER PERINEAL POST (MISCELLANEOUS) ×2 IMPLANT
COVER SURGICAL LIGHT HANDLE (MISCELLANEOUS) ×2 IMPLANT
DRAPE C-ARM 42X72 X-RAY (DRAPES) ×2 IMPLANT
DRAPE STERI IOBAN 125X83 (DRAPES) ×2 IMPLANT
DRAPE U-SHAPE 47X51 STRL (DRAPES) IMPLANT
DRSG MEPILEX BORDER 4X8 (GAUZE/BANDAGES/DRESSINGS) ×2 IMPLANT
DURAPREP 26ML APPLICATOR (WOUND CARE) ×2 IMPLANT
ELECT BLADE 4.0 EZ CLEAN MEGAD (MISCELLANEOUS) ×2
ELECT REM PT RETURN 9FT ADLT (ELECTROSURGICAL) ×2
ELECTRODE BLDE 4.0 EZ CLN MEGD (MISCELLANEOUS) ×1 IMPLANT
ELECTRODE REM PT RTRN 9FT ADLT (ELECTROSURGICAL) ×1 IMPLANT
FACESHIELD WRAPAROUND (MASK) ×4 IMPLANT
GLOVE BIO SURGEON STRL SZ7.5 (GLOVE) ×4 IMPLANT
GLOVE BIOGEL PI IND STRL 8 (GLOVE) ×2 IMPLANT
GLOVE BIOGEL PI INDICATOR 8 (GLOVE) ×2
GOWN STRL REUS W/ TWL LRG LVL3 (GOWN DISPOSABLE) ×2 IMPLANT
GOWN STRL REUS W/TWL LRG LVL3 (GOWN DISPOSABLE) ×2
KIT BASIN OR (CUSTOM PROCEDURE TRAY) ×2 IMPLANT
KIT ROOM TURNOVER OR (KITS) ×2 IMPLANT
MANIFOLD NEPTUNE II (INSTRUMENTS) ×2 IMPLANT
NDL SAFETY ECLIPSE 18X1.5 (NEEDLE) IMPLANT
NEEDLE HYPO 18GX1.5 SHARP (NEEDLE)
NEEDLE HYPO 22GX1.5 SAFETY (NEEDLE) ×2 IMPLANT
NEEDLE SPNL 18GX3.5 QUINCKE PK (NEEDLE) ×2 IMPLANT
NS IRRIG 1000ML POUR BTL (IV SOLUTION) ×2 IMPLANT
PACK TOTAL JOINT (CUSTOM PROCEDURE TRAY) ×2 IMPLANT
PAD ARMBOARD 7.5X6 YLW CONV (MISCELLANEOUS) ×2 IMPLANT
SPONGE LAP 18X18 X RAY DECT (DISPOSABLE) IMPLANT
STRIP CLOSURE SKIN 1/2X4 (GAUZE/BANDAGES/DRESSINGS) ×2 IMPLANT
SUT MNCRL AB 4-0 PS2 18 (SUTURE) ×2 IMPLANT
SUT MON AB 2-0 CT1 36 (SUTURE) ×2 IMPLANT
SUT VIC AB 0 CT1 27 (SUTURE) ×1
SUT VIC AB 0 CT1 27XBRD ANBCTR (SUTURE) ×1 IMPLANT
SUT VIC AB 1 CT1 27 (SUTURE) ×1
SUT VIC AB 1 CT1 27XBRD ANBCTR (SUTURE) ×1 IMPLANT
SUT VLOC 180 0 24IN GS25 (SUTURE) ×2 IMPLANT
SYR 50ML LL SCALE MARK (SYRINGE) ×2 IMPLANT
SYR BULB IRRIGATION 50ML (SYRINGE) ×2 IMPLANT
SYRINGE 20CC LL (MISCELLANEOUS) IMPLANT
TOWEL OR 17X24 6PK STRL BLUE (TOWEL DISPOSABLE) ×2 IMPLANT
TOWEL OR 17X26 10 PK STRL BLUE (TOWEL DISPOSABLE) ×2 IMPLANT
TRAY CATH 16FR W/PLASTIC CATH (SET/KITS/TRAYS/PACK) ×2 IMPLANT
TRAY FOLEY W/METER SILVER 16FR (SET/KITS/TRAYS/PACK) IMPLANT
WATER STERILE IRR 1000ML POUR (IV SOLUTION) ×2 IMPLANT
YANKAUER SUCT BULB TIP NO VENT (SUCTIONS) ×4 IMPLANT

## 2016-10-01 NOTE — Anesthesia Procedure Notes (Signed)
Spinal  Patient location during procedure: OR Start time: 10/01/2016 7:25 AM End time: 10/01/2016 7:35 AM Staffing Anesthesiologist: Adele Barthel P Performed: anesthesiologist  Preanesthetic Checklist Completed: patient identified, surgical consent, pre-op evaluation, timeout performed, IV checked, risks and benefits discussed and monitors and equipment checked Spinal Block Patient position: sitting Prep: DuraPrep Patient monitoring: cardiac monitor, continuous pulse ox and blood pressure Approach: midline Location: L4-5 Injection technique: single-shot Needle Needle type: Pencan  Needle gauge: 24 G Needle length: 9 cm Assessment Sensory level: T10 Additional Notes Functioning IV was confirmed and monitors were applied. Sterile prep and drape, including hand hygiene and sterile gloves were used. The patient was positioned and the spine was prepped. The skin was anesthetized with lidocaine.  Free flow of clear CSF was obtained prior to injecting local anesthetic into the CSF.  The spinal needle aspirated freely following injection.  The needle was carefully withdrawn.  The patient tolerated the procedure well.

## 2016-10-01 NOTE — Evaluation (Signed)
Physical Therapy Evaluation Patient Details Name: Terri Thomas MRN: [id number] DOB: 1950-09-21 Today's Date: 10/01/2016   History of Present Illness  Pt is a 66 y/o female s/p elective R THA, direct anterior approach. PMH includes goblet cell metaplasia, and GERD.   Clinical Impression  Pt is s/p surgery above with deficits below. PTA, pt was independent with functional mobility. Upon eval, pt limited by post op pain and weakness. Required min guard for mobility this session. Reports husband will be able to assist as needed upon d/c and will need DME below. Follow up PT per MD arrangements. Will continue to follow acutely to maximize functional mobility independence and safety.     Follow Up Recommendations DC plan and follow up therapy as arranged by surgeon;Supervision for mobility/OOB    Equipment Recommendations  Rolling walker with 5" wheels;3in1 (PT)    Recommendations for Other Services       Precautions / Restrictions Precautions Precautions: None Precaution Comments: Reviewed supine ther ex with pt.  Restrictions Weight Bearing Restrictions: Yes RLE Weight Bearing: Weight bearing as tolerated      Mobility  Bed Mobility Overal bed mobility: Needs Assistance Bed Mobility: Supine to Sit     Supine to sit: Supervision     General bed mobility comments: Supervision for safety.   Transfers Overall transfer level: Needs assistance Equipment used: Rolling walker (2 wheeled) Transfers: Sit to/from Stand Sit to Stand: Min guard         General transfer comment: Min guard for safety. Verbal cues for safe hand placement.   Ambulation/Gait Ambulation/Gait assistance: Min guard Ambulation Distance (Feet): 125 Feet Assistive device: Rolling walker (2 wheeled) Gait Pattern/deviations: Step-to pattern;Step-through pattern;Decreased step length - right;Decreased step length - left;Decreased weight shift to right;Antalgic Gait velocity: Decreased Gait velocity  interpretation: Below normal speed for age/gender General Gait Details: Slow, slightly antalgic gait. Verbal cues for sequencing with RW and for increased weightshift on RLE.   Stairs            Wheelchair Mobility    Modified Rankin (Stroke Patients Only)       Balance Overall balance assessment: Needs assistance Sitting-balance support: No upper extremity supported;Feet supported Sitting balance-Leahy Scale: Good     Standing balance support: Bilateral upper extremity supported;During functional activity Standing balance-Leahy Scale: Poor Standing balance comment: Reliant on RW for stability.                              Pertinent Vitals/Pain Pain Assessment: 0-10 Pain Score: 5  Pain Location: R hip  Pain Descriptors / Indicators: Discomfort;Operative site guarding Pain Intervention(s): Limited activity within patient's tolerance;Monitored during session;Repositioned    Home Living Family/patient expects to be discharged to:: Private residence Living Arrangements: Spouse/significant other Available Help at Discharge: Family;Available 24 hours/day Type of Home: House Home Access: Stairs to enter Entrance Stairs-Rails: None Entrance Stairs-Number of Steps: 1 (threshold step ) Home Layout: Two level;Able to live on main level with bedroom/bathroom Home Equipment: None      Prior Function Level of Independence: Independent               Hand Dominance   Dominant Hand: Right    Extremity/Trunk Assessment   Upper Extremity Assessment Upper Extremity Assessment: Overall WFL for tasks assessed    Lower Extremity Assessment Lower Extremity Assessment: RLE deficits/detail RLE Deficits / Details: Numbness at incision site. Deficits consistent with post op pain and weakness. Able  to perform exercise below.     Cervical / Trunk Assessment Cervical / Trunk Assessment: Normal  Communication   Communication: No difficulties  Cognition  Arousal/Alertness: Awake/alert Behavior During Therapy: WFL for tasks assessed/performed Overall Cognitive Status: Within Functional Limits for tasks assessed                                        General Comments      Exercises Total Joint Exercises Ankle Circles/Pumps: AROM;Both;20 reps Quad Sets: AROM;Right;10 reps Short Arc Quad: AROM;Right;10 reps Heel Slides: AROM;Right;10 reps Hip ABduction/ADduction: AROM;Right;10 reps   Assessment/Plan    PT Assessment Patient needs continued PT services  PT Problem List Decreased strength;Decreased balance;Decreased range of motion;Decreased mobility;Decreased knowledge of use of DME;Decreased knowledge of precautions;Pain       PT Treatment Interventions DME instruction;Gait training;Stair training;Functional mobility training;Therapeutic activities;Therapeutic exercise;Balance training;Neuromuscular re-education;Patient/family education    PT Goals (Current goals can be found in the Care Plan section)  Acute Rehab PT Goals Patient Stated Goal: to go home  PT Goal Formulation: With patient Time For Goal Achievement: 10/08/16 Potential to Achieve Goals: Good    Frequency 7X/week   Barriers to discharge        Co-evaluation               AM-PAC PT "6 Clicks" Daily Activity  Outcome Measure Difficulty turning over in bed (including adjusting bedclothes, sheets and blankets)?: None Difficulty moving from lying on back to sitting on the side of the bed? : None Difficulty sitting down on and standing up from a chair with arms (e.g., wheelchair, bedside commode, etc,.)?: Unable Help needed moving to and from a bed to chair (including a wheelchair)?: A Little Help needed walking in hospital room?: A Little Help needed climbing 3-5 steps with a railing? : A Little 6 Click Score: 18    End of Session Equipment Utilized During Treatment: Gait belt Activity Tolerance: Patient tolerated treatment well Patient  left: in chair;with call bell/phone within reach Nurse Communication: Mobility status PT Visit Diagnosis: Other abnormalities of gait and mobility (R26.89);Pain Pain - Right/Left: Right Pain - part of body: Hip    Time: 1531-1550 PT Time Calculation (min) (ACUTE ONLY): 19 min   Charges:   PT Evaluation $PT Eval Low Complexity: 1 Low     PT G Codes:        Leighton Ruff, PT, DPT  Acute Rehabilitation Services  Pager: [phone]   Rudean Hitt 10/01/2016, 4:38 PM

## 2016-10-01 NOTE — Anesthesia Preprocedure Evaluation (Addendum)
Anesthesia Evaluation  Patient identified by MRN, date of birth, ID band Patient awake    Reviewed: Allergy & Precautions, NPO status , Patient's Chart, lab work & pertinent test results  Airway Mallampati: II  TM Distance: >3 FB Neck ROM: Full    Dental no notable dental hx.    Pulmonary former smoker,    Pulmonary exam normal breath sounds clear to auscultation       Cardiovascular negative cardio ROS Normal cardiovascular exam Rhythm:Regular Rate:Normal     Neuro/Psych negative neurological ROS  negative psych ROS   GI/Hepatic Neg liver ROS, GERD  Medicated and Controlled,Goblet cell metaplasia   Endo/Other  Hypothyroidism   Renal/GU negative Renal ROS     Musculoskeletal  (+) Arthritis , Osteoarthritis,    Abdominal (+) + obese,   Peds  Hematology negative hematology ROS (+)   Anesthesia Other Findings   Reproductive/Obstetrics                            Anesthesia Physical Anesthesia Plan  ASA: II  Anesthesia Plan: Spinal   Post-op Pain Management:    Induction: Intravenous  PONV Risk Score and Plan: 2 and Ondansetron, Dexamethasone and Propofol infusion  Airway Management Planned: Natural Airway  Additional Equipment:   Intra-op Plan:   Post-operative Plan:   Informed Consent: I have reviewed the patients History and Physical, chart, labs and discussed the procedure including the risks, benefits and alternatives for the proposed anesthesia with the patient or authorized representative who has indicated his/her understanding and acceptance.   Dental advisory given  Plan Discussed with: CRNA  Anesthesia Plan Comments:         Anesthesia Quick Evaluation

## 2016-10-01 NOTE — Discharge Instructions (Signed)

## 2016-10-01 NOTE — Interval H&P Note (Signed)
History and Physical Interval Note:  10/01/2016 7:16 AM  Terri Thomas  has presented today for surgery, with the diagnosis of OA RIGHT HIP  The various methods of treatment have been discussed with the patient and family. After consideration of risks, benefits and other options for treatment, the patient has consented to  Procedure(s): TOTAL HIP ARTHROPLASTY ANTERIOR APPROACH (Right) as a surgical intervention .  The patient's history has been reviewed, patient examined, no change in status, stable for surgery.  I have reviewed the patient's chart and labs.  Questions were answered to the patient's satisfaction.     Terri Thomas D

## 2016-10-01 NOTE — Anesthesia Procedure Notes (Signed)
Procedure Name: MAC Date/Time: 10/01/2016 7:30 AM Performed by: Trixie Deis A Pre-anesthesia Checklist: Patient identified, Emergency Drugs available and Suction available Oxygen Delivery Method: Simple face mask Placement Confirmation: positive ETCO2

## 2016-10-01 NOTE — Transfer of Care (Signed)
Immediate Anesthesia Transfer of Care Note  Patient: [name]  Procedure(s) Performed: Procedure(s): TOTAL HIP ARTHROPLASTY ANTERIOR APPROACH (Right)  Patient Location: PACU  Anesthesia Type:Spinal  Level of Consciousness: awake  Airway & Oxygen Therapy: Patient Spontanous Breathing  Post-op Assessment: Report given to RN and Post -op Vital signs reviewed and stable  Post vital signs: Reviewed and stable  Last Vitals:  Vitals:   10/01/16 0640 10/01/16 0922  BP: 132/74 (!) 91/52  Pulse: (!) 56 (P) 74  Resp: 18 20  Temp: 36.8 C 36.4 C  SpO2: 99% 98%    Last Pain:  Vitals:   10/01/16 0640  TempSrc: Oral  PainSc: 3       Patients Stated Pain Goal: 3 (47/34/03 7096)  Complications: No apparent anesthesia complications

## 2016-10-01 NOTE — Op Note (Signed)
10/01/2016  8:48 AM  PATIENT:  Terri Thomas   MRN: [id number]  PRE-OPERATIVE DIAGNOSIS:  OA RIGHT HIP  POST-OPERATIVE DIAGNOSIS:  OA RIGHT HIP  PROCEDURE:  Procedure(s): TOTAL HIP ARTHROPLASTY ANTERIOR APPROACH  PREOPERATIVE INDICATIONS:    Terri Thomas is an 66 y.o. female who has a diagnosis of Primary osteoarthritis of right hip and elected for surgical management after failing conservative treatment.  The risks benefits and alternatives were discussed with the patient including but not limited to the risks of nonoperative treatment, versus surgical intervention including infection, bleeding, nerve injury, periprosthetic fracture, the need for revision surgery, dislocation, leg length discrepancy, blood clots, cardiopulmonary complications, morbidity, mortality, among others, and they were willing to proceed.     OPERATIVE REPORT     SURGEON:   Chisom Muntean, Ernesta Amble, MD    ASSISTANT:  Roxan Hockey, PA-C, he was present and scrubbed throughout the case, critical for completion in a timely fashion, and for retraction, instrumentation, and closure.     ANESTHESIA:  General    COMPLICATIONS:  None.     COMPONENTS:  [employer] acolade fit femur size 5 with a 36 mm -5 head ball and a PSL acetabular shell size 54 with a  polyethylene liner    PROCEDURE IN DETAIL:   The patient was met in the holding area and  identified.  The appropriate hip was identified and marked at the operative site.  The patient was then transported to the OR  and  placed under anesthesia per that record.  At that point, the patient was  placed in the supine position and  secured to the operating room table and all bony prominences padded. He received pre-operative antibiotics    The operative lower extremity was prepped from the iliac crest to the distal leg.  Sterile draping was performed.  Time out was performed prior to incision.      Skin incision was made just 2 cm lateral to the ASIS  extending in  line with the tensor fascia lata. Electrocautery was used to control all bleeders. I dissected down sharply to the fascia of the tensor fascia lata was confirmed that the muscle fibers beneath were running posteriorly. I then incised the fascia over the superficial tensor fascia lata in line with the incision. The fascia was elevated off the anterior aspect of the muscle the muscle was retracted posteriorly and protected throughout the case. I then used electrocautery to incise the tensor fascia lata fascia control and all bleeders. Immediately visible was the fat over top of the anterior neck and capsule.  I removed the anterior fat from the capsule and elevated the rectus muscle off of the anterior capsule. I then removed a large time of capsule. The retractors were then placed over the anterior acetabulum as well as around the superior and inferior neck.  I then removed a section of the femoral neck and a napkin ring fashion. Then used the power course to remove the femoral head from the acetabulum and thoroughly irrigated the acetabulum. I sized the femoral head.    I then exposed the deep acetabulum, cleared out any tissue including the ligamentum teres.   After adequate visualization, I excised the labrum, and then sequentially reamed.  I then impacted the acetabular implant into place using fluoroscopy for guidance.  Appropriate version and inclination was confirmed clinically matching their bony anatomy, and with fluoroscopy.  I placed a 20 mm screw in the posterior/superio position with an excellent bite.  I then placed the polyethylene liner in place  I then adducted the leg and released the external rotators from the posterior femur allowing it to be easily delivered up lateral and anterior to the acetabulum for preparation of the femoral canal.    I then prepared the proximal femur using the cookie-cutter and then sequentially reamed and broached.  A trial broach, neck, and head was  utilized, and I reduced the hip and used floroscopy to assess the neck length and femoral implant.  I then impacted the femoral prosthesis into place into the appropriate version. The hip was then reduced and fluoroscopy confirmed appropriate position. Leg lengths were restored.  I then irrigated the hip copiously again with, and repaired the fascia with Vicryl, followed by monocryl for the subcutaneous tissue, Monocryl for the skin, Steri-Strips and sterile gauze. The patient was then awakened and returned to PACU in stable and satisfactory condition. There were no complications.  POST OPERATIVE PLAN: WBAT, DVT px: SCD's/TED, ambulation and chemical dvt px  Sheilyn Boehlke, MD Orthopedic Surgeon [phone]     

## 2016-10-01 NOTE — Anesthesia Postprocedure Evaluation (Signed)
Anesthesia Post Note  Patient: Terri Thomas  Procedure(s) Performed: Procedure(s) (LRB): TOTAL HIP ARTHROPLASTY ANTERIOR APPROACH (Right)     Patient location during evaluation: PACU Anesthesia Type: Spinal Level of consciousness: oriented and awake and alert Pain management: pain level controlled Vital Signs Assessment: post-procedure vital signs reviewed and stable Respiratory status: spontaneous breathing, respiratory function stable and patient connected to nasal cannula oxygen Cardiovascular status: blood pressure returned to baseline and stable Postop Assessment: no headache, no backache and no apparent nausea or vomiting Anesthetic complications: no    Last Vitals:  Vitals:   10/01/16 1045 10/01/16 1300  BP: 103/64 116/77  Pulse: (!) 56 63  Resp: 13 16  Temp:  36.9 C  SpO2: 100% 98%    Last Pain:  Vitals:   10/01/16 1300  TempSrc: Oral  PainSc:                  [name] P Marnell Mcdaniel

## 2016-10-02 ENCOUNTER — Encounter (HOSPITAL_COMMUNITY): Payer: Self-pay | Admitting: Orthopedic Surgery

## 2016-10-02 NOTE — Progress Notes (Signed)
Physical Therapy Treatment Patient Details Name: Terri Thomas MRN: [id number] DOB: [birth year] Today's Date: 10/02/2016    History of Present Illness Pt is a 66 y/o female s/p elective R THA, direct anterior approach. PMH includes goblet cell metaplasia, and GERD.     PT Comments    Patient able to negotiate step and simulate car transfers in prep for d/c home today.  No further skilled PT in acute setting indicated.  Pt states HHPT set up for few sessions after d/c which seems appropriate.  Will have spouse assist and to get walker for home.     Follow Up Recommendations  DC plan and follow up therapy as arranged by surgeon;Supervision for mobility/OOB     Equipment Recommendations  Rolling walker with 5" wheels;3in1 (PT)    Recommendations for Other Services       Precautions / Restrictions Precautions Precautions: None Precaution Comments: Reviewed supine ther ex with pt.  Restrictions Weight Bearing Restrictions: Yes RLE Weight Bearing: Weight bearing as tolerated    Mobility  Bed Mobility   Bed Mobility: Sit to Supine     Supine to sit: Supervision Sit to supine: Supervision   General bed mobility comments: up in recliner  Transfers Overall transfer level: Needs assistance Equipment used: Rolling walker (2 wheeled) Transfers: Sit to/from Stand Sit to Stand: Supervision         General transfer comment: PATIENT USING GOOD TECHNIQUE.  Ambulation/Gait Ambulation/Gait assistance: Supervision Ambulation Distance (Feet): 135 Feet Assistive device: Rolling walker (2 wheeled) Gait Pattern/deviations: Step-to pattern;Decreased stride length;Antalgic;Decreased step length - left     General Gait Details: attempted step throught technique, but too painful at incision site anterior R hip so encouraged step to pattern for now.   Stairs Stairs: Yes   Stair Management: Forwards;With walker Number of Stairs: 1 General stair comments: cues and demo for both  forward and reverse technique, pt chose forward, cues for sequence  Wheelchair Mobility    Modified Rankin (Stroke Patients Only)       Balance Overall balance assessment: Needs assistance Sitting-balance support: No upper extremity supported;Feet supported Sitting balance-Leahy Scale: Good     Standing balance support: No upper extremity supported Standing balance-Leahy Scale: Fair Standing balance comment: able to let go of walker in static standing                            Cognition Arousal/Alertness: Awake/alert Behavior During Therapy: WFL for tasks assessed/performed Overall Cognitive Status: Within Functional Limits for tasks assessed                                        Exercises Total Joint Exercises Ankle Circles/Pumps: AROM;Both;10 reps Quad Sets: AROM;Right;10 reps;Seated Heel Slides: AROM;10 reps;Right;Seated Hip ABduction/ADduction: AROM;Right;10 reps;Seated Long Arc Quad: AROM;Right;10 reps;Seated    General Comments General comments (skin integrity, edema, etc.): reveiwed HEP and info on handout      Pertinent Vitals/Pain Pain Assessment: 0-10 Pain Score: 4  Pain Location: R hip Pain Descriptors / Indicators: Sore Pain Intervention(s): Monitored during session;Repositioned;Ice applied    Home Living Family/patient expects to be discharged to:: Private residence Living Arrangements: Spouse/significant other Available Help at Discharge: Family;Available 24 hours/day Type of Home: House Home Access: Stairs to enter   Home Layout: Two level;Able to live on main level with bedroom/bathroom Home Equipment: None  Prior Function Level of Independence: Independent          PT Goals (current goals can now be found in the care plan section) Acute Rehab PT Goals Patient Stated Goal: TO GO HOME Progress towards PT goals: Progressing toward goals    Frequency    7X/week      PT Plan Current plan remains  appropriate    Co-evaluation              AM-PAC PT "6 Clicks" Daily Activity  Outcome Measure  Difficulty turning over in bed (including adjusting bedclothes, sheets and blankets)?: None Difficulty moving from lying on back to sitting on the side of the bed? : None Difficulty sitting down on and standing up from a chair with arms (e.g., wheelchair, bedside commode, etc,.)?: A Little Help needed moving to and from a bed to chair (including a wheelchair)?: A Little Help needed walking in hospital room?: A Little Help needed climbing 3-5 steps with a railing? : A Little 6 Click Score: 20    End of Session Equipment Utilized During Treatment: Gait belt Activity Tolerance: Patient tolerated treatment well Patient left: in chair;with call bell/phone within reach   PT Visit Diagnosis: Other abnormalities of gait and mobility (R26.89);Pain Pain - Right/Left: Right Pain - part of body: Hip     Time: 1005-1029 PT Time Calculation (min) (ACUTE ONLY): 24 min  Charges:  $Gait Training: 8-22 mins $Therapeutic Exercise: 8-22 mins                    G CodesMagda Kiel, [place] [phone] 10/02/2016    Reginia Naas 10/02/2016, 10:53 AM

## 2016-10-02 NOTE — Discharge Summary (Signed)
Discharge Summary  Patient ID: Terri Thomas MRN: [id number] DOB/AGE: [birth year] 66 y.o.  Admit date: 10/01/2016 Discharge date: 10/02/2016  Admission Diagnoses:  Primary osteoarthritis of right hip  Discharge Diagnoses:  Principal Problem:   Primary osteoarthritis of right hip Active Problems:   Gastroesophageal reflux disease with esophagitis   Herpes simplex type 2 infection   Internal hemorrhoids   Postoperative hypothyroidism   Past Medical History:  Diagnosis Date  . Arthritis   . GERD (gastroesophageal reflux disease)   . HSV-1 (herpes simplex virus 1) infection   . Hypothyroidism   . Other abnormality of brain or central nervous system function study    Goblet cell metaplasia  . Thyroid disease   . Viral meningitis 2011    Surgeries: Procedure(s): TOTAL HIP ARTHROPLASTY ANTERIOR APPROACH on 10/01/2016   Consultants (if any):   Discharged Condition: Improved  Hospital Course: Terri Thomas is an 66 y.o. female who was admitted 10/01/2016 with a diagnosis of Primary osteoarthritis of right hip and went to the operating room on 10/01/2016 and underwent the above named procedures.    She was given perioperative antibiotics:  Anti-infectives    Start     Dose/Rate Route Frequency Ordered Stop   10/02/16 0000  valACYclovir (VALTREX) tablet 500 mg     500 mg Oral 2 times daily PRN 10/01/16 1111     10/01/16 1300  ceFAZolin (ANCEF) IVPB 2g/100 mL premix     2 g 200 mL/hr over 30 Minutes Intravenous Every 6 hours 10/01/16 0936 10/01/16 1930   10/01/16 0618  ceFAZolin (ANCEF) IVPB 2g/100 mL premix     2 g 200 mL/hr over 30 Minutes Intravenous On call to O.R. 10/01/16 1601 10/01/16 0740    .  She was given sequential compression devices, early ambulation, and ASA 325 mg for DVT prophylaxis.  She benefited maximally from the hospital stay and there were no complications.    Recent vital signs:  Vitals:   10/02/16 0018 10/02/16 0420  BP: 135/70 (!) 114/58   Pulse: 74 67  Resp: 17 16  Temp: 99.1 F (37.3 C) 98.4 F (36.9 C)  SpO2: 99% 91%    Recent laboratory studies:  Lab Results  Component Value Date   HGB 13.1 09/17/2016   HGB 14.1 11/16/2013   Lab Results  Component Value Date   WBC 3.3 (L) 09/17/2016   PLT 149 (L) 09/17/2016   No results found for: INR Lab Results  Component Value Date   NA 141 09/17/2016   K 4.0 09/17/2016   CL 107 09/17/2016   CO2 23 09/17/2016   BUN 25 (H) 09/17/2016   CREATININE 1.09 (H) 09/17/2016   GLUCOSE 114 (H) 09/17/2016    Discharge Medications:   Allergies as of 10/02/2016      Reactions   Contrast Media [iodinated Diagnostic Agents] Hives, Itching   Erythromycin Hives   Metrizamide Hives, Itching   Latex Rash      Medication List    STOP taking these medications   acetaminophen 650 MG CR tablet Commonly known as:  TYLENOL   ibuprofen 200 MG tablet Commonly known as:  ADVIL,MOTRIN     TAKE these medications   aspirin EC 325 MG tablet Take 1 tablet (325 mg total) by mouth daily. For 30 days post op for DVT Prophylaxis   BIOFREEZE EX Apply 1 application topically daily as needed (pain).   celecoxib 200 MG capsule Commonly known as:  CELEBREX Take 1 capsule (200 mg  total) by mouth 2 (two) times daily. For 2 weeks post op.  Discontinue Ibuprofen when taking this medicine.   CENTRUM SILVER PO Take 1 tablet by mouth daily.   docusate sodium 100 MG capsule Commonly known as:  COLACE Take 1 capsule (100 mg total) by mouth 2 (two) times daily. To prevent constipation while taking pain medication.   FIBER ADULT GUMMIES PO Take 2 each by mouth daily.   HYDROcodone-acetaminophen 5-325 MG tablet Commonly known as:  NORCO Take 1-2 tablets by mouth every 4 (four) hours as needed for moderate pain.   levothyroxine 137 MCG tablet Commonly known as:  SYNTHROID, LEVOTHROID Take 137 mcg by mouth daily before breakfast.   methocarbamol 500 MG tablet Commonly known as:   ROBAXIN Take 1 tablet (500 mg total) by mouth every 6 (six) hours as needed for muscle spasms.   ondansetron 4 MG tablet Commonly known as:  ZOFRAN Take 1 tablet (4 mg total) by mouth every 8 (eight) hours as needed for nausea or vomiting.   ranitidine 150 MG tablet Commonly known as:  ZANTAC Take 150 mg by mouth 2 (two) times daily.   valACYclovir 500 MG tablet Commonly known as:  VALTREX Take 500 mg by mouth 2 (two) times daily as needed (takes 1 tablet daily but 1 tablet twice daily  if having outbreak).            Discharge Care Instructions        Start     Ordered   10/01/16 0000  aspirin EC 325 MG tablet  Daily     10/01/16 0931   10/01/16 0000  celecoxib (CELEBREX) 200 MG capsule  2 times daily     10/01/16 0931   10/01/16 0000  docusate sodium (COLACE) 100 MG capsule  2 times daily     10/01/16 0931   10/01/16 0000  ondansetron (ZOFRAN) 4 MG tablet  Every 8 hours PRN     10/01/16 0931   10/01/16 0000  methocarbamol (ROBAXIN) 500 MG tablet  Every 6 hours PRN     10/01/16 0931   10/01/16 0000  HYDROcodone-acetaminophen (NORCO) 5-325 MG tablet  Every 4 hours PRN     10/01/16 0931      Diagnostic Studies: Dg C-arm 1-60 Min  Result Date: 10/01/2016 CLINICAL DATA:  Status post right total hip arthroplasty. EXAM: OPERATIVE right HIP (WITH PELVIS IF PERFORMED) 2 VIEWS TECHNIQUE: Fluoroscopic spot image(s) were submitted for interpretation post-operatively. COMPARISON:  None. FINDINGS: Two images of the right hip show placement of a total hip arthroplasty device. Hardware components appear to be in anatomic alignment. No periprosthetic fracture or subluxation. IMPRESSION: 1. Status post right total hip arthroplasty. Electronically Signed   By: Kerby Moors M.D.   On: 10/01/2016 09:13   Dg Hip Operative Unilat W Or W/o Pelvis Right  Result Date: 10/01/2016 CLINICAL DATA:  Status post right total hip arthroplasty. EXAM: OPERATIVE right HIP (WITH PELVIS IF PERFORMED) 2  VIEWS TECHNIQUE: Fluoroscopic spot image(s) were submitted for interpretation post-operatively. COMPARISON:  None. FINDINGS: Two images of the right hip show placement of a total hip arthroplasty device. Hardware components appear to be in anatomic alignment. No periprosthetic fracture or subluxation. IMPRESSION: 1. Status post right total hip arthroplasty. Electronically Signed   By: Kerby Moors M.D.   On: 10/01/2016 09:13    Disposition: 01-Home or Self Care    Follow-up Information    Renette Butters, MD Follow up.   Specialty:  Orthopedic  Surgery Contact information: [address] [place] [id number] [phone]            Signed: Prudencio Burly III PA-C 10/02/2016, 7:16 AM

## 2016-10-02 NOTE — Care Management Note (Addendum)
Case Management Note  Patient Details  Name: Terri Thomas MRN: [id number] Date of Birth: 1950-10-26  Subjective/Objective:  66 yr old female s/p right total hip arthroplasty.        Action/Plan: Case manager spoke with patient concerning discharge plan and DME. Patient was preoperatively setup with [facility], no changes. Has elevated toilet, doesn't want a 3in1. Patient will have family support at discharge.   Expected Discharge Date:  10/02/16               Expected Discharge Plan:  [place]  In-House Referral:  NA  Discharge planning Services  CM Consult  Post Acute Care Choice:  Durable Medical Equipment, Home Health Choice offered to:  Patient  DME Arranged:  Walker rolling DME Agency:  [employer]  HH Arranged:  PT [place] Agency:  [facility] [employer]  Status of Service:  Completed, signed off  If discussed at [employer] [facility] [employer], dates discussed:    Additional Comments:  Ninfa Meeker, RN 10/02/2016, 11:17 AM

## 2016-10-02 NOTE — Evaluation (Signed)
Occupational Therapy Evaluation Patient Details Name: Terri Thomas MRN: [id number] DOB: [birth year] Today's Date: 10/02/2016    History of Present Illness Pt is a 66 y/o female s/p elective R THA, direct anterior approach. PMH includes goblet cell metaplasia, and GERD.    Clinical Impression   PATIENT WAS COOPERATIVE DURING SESSION. PATIENT WAS MOTIVATED TO INCREASE ABILITY TO CARE FOR SELF. PNT WAS EDUCATED ON USE OF AE BUT WILL HAVE HUSBAND ASSIST UNTIL SHE CAN DRESS HERSELF. PNT WAS EDUCATED ON USE OF SHOWER SEAT AND LONG HANDLED SHOWER BRUSH TO INCREASE I AND SAFETY. PNT WAS EDUCATED ON USE OF WALKER IN HOME TO INCREASE SAFETY. PATIENT IS D/CING HOME TODAY.     Follow Up Recommendations  Home health OT    Equipment Recommendations  None recommended by OT    Recommendations for Other Services       Precautions / Restrictions Precautions Precautions: None Precaution Comments: Reviewed supine ther ex with pt.  Restrictions Weight Bearing Restrictions: Yes RLE Weight Bearing: Weight bearing as tolerated      Mobility Bed Mobility   Bed Mobility: Sit to Supine     Supine to sit: Supervision Sit to supine: Supervision      Transfers       Sit to Stand: Supervision         General transfer comment: PATIENT USING GOOD TECHNIQUE.    Balance                                           ADL either performed or assessed with clinical judgement   ADL Overall ADL's : Needs assistance/impaired Eating/Feeding: Independent   Grooming: Wash/dry hands;Wash/dry face;Supervision/safety   Upper Body Bathing: Set up   Lower Body Bathing: Minimal assistance   Upper Body Dressing : Set up   Lower Body Dressing: Minimal assistance;Moderate assistance   Toilet Transfer: Supervision/safety;Comfort height toilet;Grab bars;RW   Toileting- Clothing Manipulation and Hygiene: Supervision/safety   Tub/ Shower Transfer: Min guard   Functional mobility  during ADLs: Supervision/safety;Rolling walker General ADL Comments: PATIENT WAS EDUCATED ON AE FOR LE DRESSING BUT WILL HAVE HUSBAND ASSIST.      Vision Baseline Vision/History: Wears glasses Wears Glasses: At all times Patient Visual Report: No change from baseline       Perception     Praxis      Pertinent Vitals/Pain Pain Assessment: 0-10 Pain Score: 5  Pain Location:  (r hip) Pain Descriptors / Indicators: Aching Pain Intervention(s): Monitored during session;Premedicated before session     Hand Dominance Right   Extremity/Trunk Assessment Upper Extremity Assessment Upper Extremity Assessment: Overall WFL for tasks assessed           Communication Communication Communication: No difficulties   Cognition Arousal/Alertness: Awake/alert Behavior During Therapy: WFL for tasks assessed/performed Overall Cognitive Status: Within Functional Limits for tasks assessed                                     General Comments       Exercises     Shoulder Instructions      Home Living Family/patient expects to be discharged to:: Private residence Living Arrangements: Spouse/significant other Available Help at Discharge: Family;Available 24 hours/day Type of Home: House Home Access: Stairs to enter     Home Layout: Two  level;Able to live on main level with bedroom/bathroom     Bathroom Shower/Tub: Occupational psychologist: Handicapped height     Home Equipment: None          Prior Functioning/Environment Level of Independence: Independent                 OT Problem List: Decreased knowledge of use of DME or AE;Pain      OT Treatment/Interventions:      OT Goals(Current goals can be found in the care plan section) Acute Rehab OT Goals Patient Stated Goal: TO GO HOME  OT Frequency:     Barriers to D/C:            Co-evaluation              AM-PAC PT "6 Clicks" Daily Activity     Outcome Measure Help from  another person eating meals?: None Help from another person taking care of personal grooming?: A Little Help from another person toileting, which includes using toliet, bedpan, or urinal?: A Little Help from another person bathing (including washing, rinsing, drying)?: A Little Help from another person to put on and taking off regular upper body clothing?: None Help from another person to put on and taking off regular lower body clothing?: A Little 6 Click Score: 20   End of Session Equipment Utilized During Treatment: Gait belt;Rolling walker Nurse Communication:  (OK WORKING WITH PATIENT)  Activity Tolerance: Patient tolerated treatment well Patient left: in bed;with call bell/phone within reach  OT Visit Diagnosis: Other abnormalities of gait and mobility (R26.89);Pain Pain - Right/Left: Right Pain - part of body: Hip                Time: 0820-0850 OT Time Calculation (min): 30 min Charges:  OT General Charges $OT Visit: 1 Visit OT Evaluation $OT Eval Low Complexity: 1 Low OT Treatments $Self Care/Home Management : 8-22 mins G-Codes:     6 CLICKS  Camauri Craton 10/02/2016, 9:00 AM

## 2016-10-02 NOTE — Progress Notes (Addendum)
   Assessment / Plan: 1 Day Post-Op  S/P Procedure(s) (LRB): TOTAL HIP ARTHROPLASTY ANTERIOR APPROACH (Right) by Dr. Ernesta Amble. Murphy on 10/01/16  Principal Problem:   Primary osteoarthritis of right hip Active Problems:   Gastroesophageal reflux disease with esophagitis   Herpes simplex type 2 infection   Internal hemorrhoids   Postoperative hypothyroidism  Doing very well.  Pain controlled.  Eating, drinking, voiding.  OOB in room.  Not yet up with therapy.  Desires discharge later today.  Advance diet Up with therapy D/C IV fluids Incentive Spirometry Apply ice  Weight Bearing: Weight Bearing as Tolerated (WBAT)  Dressings: Mepilex.  VTE prophylaxis: Aspirin, SCDs, ambulation Dispo: Home later today pending therapy evaluation.  Subjective: Patient reports pain as mild to moderate.  Tolerating diet.  Urinating.  +Flatus.  No CP, SOB.  OOB in room.  Objective:   VITALS:   Vitals:   10/01/16 1300 10/01/16 2033 10/02/16 0018 10/02/16 0420  BP: 116/77 (!) 106/52 135/70 (!) 114/58  Pulse: 63 69 74 67  Resp: 16 17 17 16   Temp: 98.4 F (36.9 C) 98.1 F (36.7 C) 99.1 F (37.3 C) 98.4 F (36.9 C)  TempSrc: Oral Oral Oral Oral  SpO2: 98% 97% 99% 91%  Weight:      Height:       CBC Latest Ref Rng & Units 09/17/2016 11/16/2013  WBC 4.0 - 10.5 K/uL 3.3(L) 5.0  Hemoglobin 12.0 - 15.0 g/dL 13.1 14.1  Hematocrit 36.0 - 46.0 % 38.7 41.1  Platelets 150 - 400 K/uL 149(L) 192   BMP Latest Ref Rng & Units 09/17/2016 11/16/2013  Glucose 65 - 99 mg/dL 114(H) 97  BUN 6 - 20 mg/dL 25(H) 17  Creatinine 0.44 - 1.00 mg/dL 1.09(H) 0.80  Sodium 135 - 145 mmol/L 141 142  Potassium 3.5 - 5.1 mmol/L 4.0 4.3  Chloride 101 - 111 mmol/L 107 102  CO2 22 - 32 mmol/L 23 28  Calcium 8.9 - 10.3 mg/dL 9.7 10.0   Intake/Output      09/18 0701 - 09/19 0700 09/19 0701 - 09/20 0700   P.O. 600    I.V. (mL/kg) 3660.4 (41.4)    IV Piggyback 0    Total Intake(mL/kg) 4260.4 (48.1)    Blood 100    Total Output 100     Net +4160.4          Urine Occurrence 4 x       Physical Exam: General: NAD.  Upright in bed.  Calm, conversant. Resp: No increased wob Cardio: regular rate and rhythm ABD soft Neurologically intact MSK Neurovascularly intact Sensation intact distally Feet warm Dorsiflexion/Plantar flexion intact Incision: dressing C/D/I   Prudencio Burly III, PA-C 10/02/2016, 7:10 AM

## 2018-05-24 IMAGING — RF DG HIP (WITH PELVIS) OPERATIVE*R*
1 series · 2 of 2 positions shown · non-contrast
Comparison: None.

CLINICAL DATA: Status post right total hip arthroplasty.

EXAM:
OPERATIVE right HIP (WITH PELVIS IF PERFORMED) 2 VIEWS
TECHNIQUE: Fluoroscopic spot image(s) were submitted for interpretation
post-operatively.

[Series 1: run · 2 of 2 slices shown]
[im 1/2]
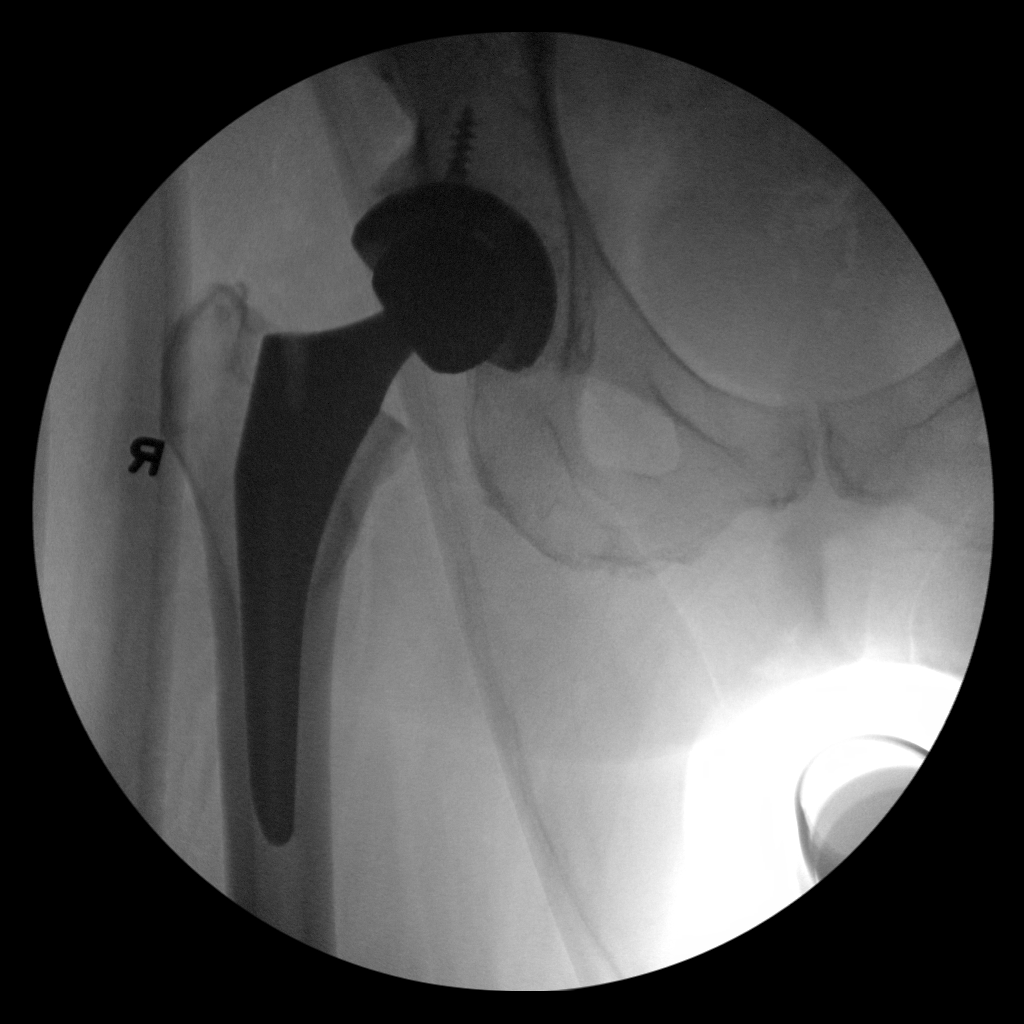
[im 2/2]
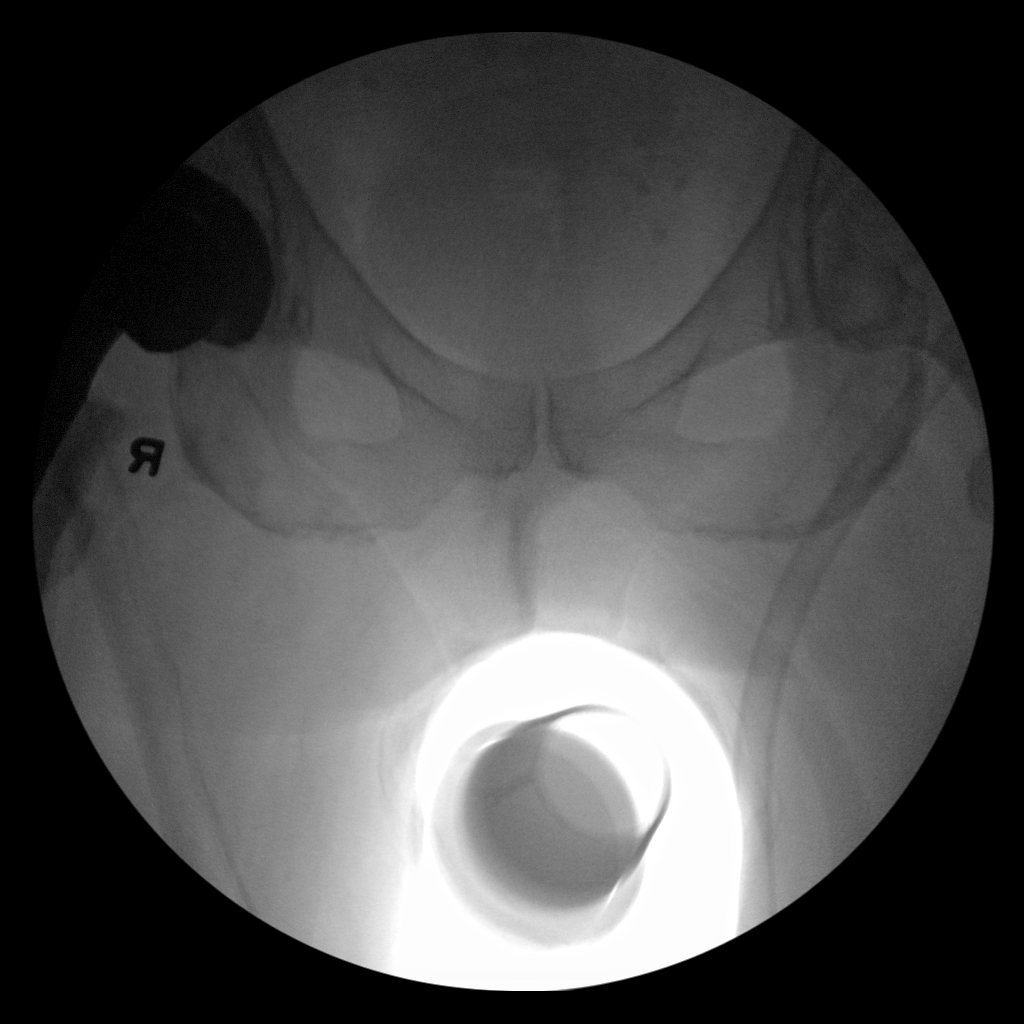

[2 of 2 positions shown; findings below may reference images not displayed]

FINDINGS: Two images of the right hip show placement of a total hip
arthroplasty device. Hardware components appear to be in anatomic
alignment. No periprosthetic fracture or subluxation.
IMPRESSION: 1. Status post right total hip arthroplasty.

## 2019-02-13 ENCOUNTER — Ambulatory Visit: Payer: 59

## 2019-02-18 ENCOUNTER — Ambulatory Visit: Payer: 59

## 2019-02-19 ENCOUNTER — Ambulatory Visit: Payer: 59

## 2019-02-20 ENCOUNTER — Ambulatory Visit: Payer: Medicare Other | Attending: Internal Medicine

## 2019-02-20 DIAGNOSIS — Z23 Encounter for immunization: Secondary | ICD-10-CM | POA: Insufficient documentation

## 2019-02-20 NOTE — Progress Notes (Signed)
   Covid-19 Vaccination Clinic  Name:  Terri Thomas    MRN: [id number] DOB: [birth year]  02/20/2019  Terri Thomas was observed post Covid-19 immunization for 15 minutes without incidence. She was provided with Vaccine Information Sheet and instruction to access the V-Safe system.   Terri Thomas was instructed to call 911 with any severe reactions post vaccine: Marland Kitchen Difficulty breathing  . Swelling of your face and throat  . A fast heartbeat  . A bad rash all over your body  . Dizziness and weakness    Immunizations Administered    Name Date Dose VIS Date Route   Pfizer COVID-19 Vaccine 02/20/2019  8:18 AM 0.3 mL 12/25/2018 Intramuscular   Manufacturer: [place]   Lot: [id number]   [place]: [id number]

## 2019-03-16 ENCOUNTER — Ambulatory Visit: Payer: Medicare Other | Attending: Internal Medicine

## 2019-03-16 ENCOUNTER — Ambulatory Visit: Payer: Medicare Other

## 2019-03-16 DIAGNOSIS — Z23 Encounter for immunization: Secondary | ICD-10-CM | POA: Insufficient documentation

## 2019-03-16 NOTE — Progress Notes (Signed)
   Covid-19 Vaccination Clinic  Name:  SATURN HABERLAND    MRN: [id number] DOB: 08-Dec-1950  03/16/2019  Ms. Aguinaga was observed post Covid-19 immunization for 15 minutes without incident. She was provided with Vaccine Information Sheet and instruction to access the V-Safe system.   Ms. Cordery was instructed to call 911 with any severe reactions post vaccine: Marland Kitchen Difficulty breathing  . Swelling of face and throat  . A fast heartbeat  . A bad rash all over body  . Dizziness and weakness   Immunizations Administered    Name Date Dose VIS Date Route   Pfizer COVID-19 Vaccine 03/16/2019 12:51 PM 0.3 mL 12/25/2018 Intramuscular   Manufacturer: [place]   Lot: [id number]   [place]: [id number]

## 2019-11-14 ENCOUNTER — Other Ambulatory Visit: Payer: Self-pay

## 2019-11-14 ENCOUNTER — Emergency Department (HOSPITAL_COMMUNITY): Payer: Medicare Other

## 2019-11-14 ENCOUNTER — Observation Stay (HOSPITAL_COMMUNITY)
Admission: EM | Admit: 2019-11-14 | Discharge: 2019-11-15 | Disposition: A | Discharge status: Home / Self Care | Payer: Medicare Other | Attending: Internal Medicine | Admitting: Internal Medicine

## 2019-11-14 ENCOUNTER — Encounter (HOSPITAL_COMMUNITY): Payer: Self-pay | Admitting: *Deleted

## 2019-11-14 DIAGNOSIS — R0600 Dyspnea, unspecified: Secondary | ICD-10-CM

## 2019-11-14 DIAGNOSIS — R7989 Other specified abnormal findings of blood chemistry: Secondary | ICD-10-CM

## 2019-11-14 DIAGNOSIS — D72829 Elevated white blood cell count, unspecified: Principal | ICD-10-CM | POA: Insufficient documentation

## 2019-11-14 DIAGNOSIS — Z87891 Personal history of nicotine dependence: Secondary | ICD-10-CM | POA: Diagnosis not present

## 2019-11-14 DIAGNOSIS — R079 Chest pain, unspecified: Secondary | ICD-10-CM

## 2019-11-14 DIAGNOSIS — R002 Palpitations: Secondary | ICD-10-CM | POA: Diagnosis not present

## 2019-11-14 DIAGNOSIS — R0602 Shortness of breath: Secondary | ICD-10-CM

## 2019-11-14 DIAGNOSIS — Z96641 Presence of right artificial hip joint: Secondary | ICD-10-CM | POA: Insufficient documentation

## 2019-11-14 DIAGNOSIS — Z91040 Latex allergy status: Secondary | ICD-10-CM | POA: Insufficient documentation

## 2019-11-14 DIAGNOSIS — Z20822 Contact with and (suspected) exposure to covid-19: Secondary | ICD-10-CM | POA: Diagnosis not present

## 2019-11-14 DIAGNOSIS — Z7982 Long term (current) use of aspirin: Secondary | ICD-10-CM | POA: Diagnosis not present

## 2019-11-14 DIAGNOSIS — E039 Hypothyroidism, unspecified: Secondary | ICD-10-CM | POA: Insufficient documentation

## 2019-11-14 DIAGNOSIS — I82409 Acute embolism and thrombosis of unspecified deep veins of unspecified lower extremity: Secondary | ICD-10-CM

## 2019-11-14 LAB — CBC
HCT: 32.6 % — ABNORMAL LOW (ref 36.0–46.0)
Hemoglobin: 9.4 g/dL — ABNORMAL LOW (ref 12.0–15.0)
MCH: 27.6 pg (ref 26.0–34.0)
MCHC: 28.8 g/dL — ABNORMAL LOW (ref 30.0–36.0)
MCV: 95.6 fL (ref 80.0–100.0)
Platelets: 147 10*3/uL — ABNORMAL LOW (ref 150–400)
RBC: 3.41 MIL/uL — ABNORMAL LOW (ref 3.87–5.11)
RDW: 18.1 % — ABNORMAL HIGH (ref 11.5–15.5)
WBC: 418.4 10*3/uL (ref 4.0–10.5)
nRBC: 0.5 % — ABNORMAL HIGH (ref 0.0–0.2)

## 2019-11-14 LAB — BASIC METABOLIC PANEL
Anion gap: 13 (ref 5–15)
BUN: 18 mg/dL (ref 8–23)
CO2: 24 mmol/L (ref 22–32)
Calcium: 9.4 mg/dL (ref 8.9–10.3)
Chloride: 105 mmol/L (ref 98–111)
Creatinine, Ser: 0.9 mg/dL (ref 0.44–1.00)
GFR, Estimated: >60 mL/min (ref >60)
Glucose, Bld: 108 mg/dL — ABNORMAL HIGH (ref 70–99)
Potassium: 3.1 mmol/L — ABNORMAL LOW (ref 3.5–5.1)
Sodium: 142 mmol/L (ref 135–145)

## 2019-11-14 LAB — TROPONIN I (HIGH SENSITIVITY): Troponin I (High Sensitivity): 35 ng/L — ABNORMAL HIGH (ref <18)

## 2019-11-14 LAB — APTT: aPTT: 31 seconds (ref 24–36)

## 2019-11-14 MED ORDER — SODIUM CHLORIDE 0.9 % IV SOLN: 300.0000 mg | Freq: Every day | INTRAVENOUS | Status: DC

## 2019-11-14 MED ORDER — ALLOPURINOL 300 MG PO TABS
300.0000 mg | ORAL_TABLET | Freq: Every day | ORAL | Status: DC
  Administered 2019-11-15: 300 mg via ORAL
  Filled 2019-11-14: qty 1

## 2019-11-14 NOTE — ED Provider Notes (Addendum)
[facility] EMERGENCY DEPARTMENT Provider Note   CSN: [id number] Arrival date & time: 11/14/19  2054     History Chief Complaint  Patient presents with  . Shortness of Breath  . dvt    Terri Thomas is a 69 y.o. female.  Patient presents to the emergency department with a chief complaint of shortness of breath and palpitations.  She also reports having intermittent sharp chest pains.  She states that she was recently diagnosed with a DVT in her right lower extremity.  She has been compliant with taking Xarelto for the past 1 week.  She denies any fever, chills, cough.  Additionally, she states that she has had some travelers diarrhea, and was taking Cipro, but broke out into a rash.  She discontinued the Cipro and is currently on a prednisone taper.  She denies having history of PE or DVT prior to her most recent diagnosis.  Denies any other associated symptoms.  The history is provided by the patient. No language interpreter was used.       Past Medical History:  Diagnosis Date  . Arthritis   . GERD (gastroesophageal reflux disease)   . HSV-1 (herpes simplex virus 1) infection   . Hypothyroidism   . Other abnormality of brain or central nervous system function study    Goblet cell metaplasia  . Thyroid disease   . Viral meningitis 2011    Patient Active Problem List   Diagnosis Date Noted  . Primary osteoarthritis of right hip 09/02/2016  . Postoperative hypothyroidism 11/08/2015  . Herpes simplex type 2 infection 06/19/2015  . Internal hemorrhoids 03/21/2015  . Gastroesophageal reflux disease with esophagitis 03/20/2015    Past Surgical History:  Procedure Laterality Date  . APPENDECTOMY    . COLONOSCOPY    . ESOPHAGOGASTRODUODENOSCOPY    . MENISCUS REPAIR Left 12/2015  . THYROIDECTOMY    . TOTAL HIP ARTHROPLASTY Right 10/01/2016   Procedure: TOTAL HIP ARTHROPLASTY ANTERIOR APPROACH;  Surgeon: Renette Butters, MD;  Location: [place];   Service: Orthopedics;  Laterality: Right;     OB History   No obstetric history on file.     Family History  Problem Relation Age of Onset  . Stroke Mother   . Prostate cancer Father     Social History   Tobacco Use  . Smoking status: Former Smoker    Packs/day: 0.50    Years: 10.00    Pack years: 5.00    Quit date: 09/14/1984    Years since quitting: 35.1  . Smokeless tobacco: Never Used  Vaping Use  . Vaping Use: Never used  Substance Use Topics  . Alcohol use: Yes    Alcohol/week: 1.0 standard drink    Types: 1 Glasses of wine per week    Comment: daily  . Drug use: No    Home Medications Prior to Admission medications   Medication Sig Start Date End Date Taking? Authorizing Provider  aspirin EC 325 MG tablet Take 1 tablet (325 mg total) by mouth daily. For 30 days post op for DVT Prophylaxis 10/01/16   Prudencio Burly III, PA-C  docusate sodium (COLACE) 100 MG capsule Take 1 capsule (100 mg total) by mouth 2 (two) times daily. To prevent constipation while taking pain medication. 10/01/16   Prudencio Burly III, PA-C  FIBER ADULT GUMMIES PO Take 2 each by mouth daily.    [provider]  HYDROcodone-acetaminophen (NORCO) 5-325 MG tablet Take 1-2 tablets by mouth  every 4 (four) hours as needed for moderate pain. 10/01/16   Martensen, Charna Elizabeth III, PA-C  levothyroxine (SYNTHROID, LEVOTHROID) 137 MCG tablet Take 137 mcg by mouth daily before breakfast.    [provider]  Menthol, Topical Analgesic, (BIOFREEZE EX) Apply 1 application topically daily as needed (pain).    [provider]  methocarbamol (ROBAXIN) 500 MG tablet Take 1 tablet (500 mg total) by mouth every 6 (six) hours as needed for muscle spasms. 10/01/16   Prudencio Burly III, PA-C  Multiple Vitamins-Minerals (CENTRUM SILVER PO) Take 1 tablet by mouth daily.    [provider]  ondansetron (ZOFRAN) 4 MG tablet Take 1 tablet (4 mg total) by mouth every  8 (eight) hours as needed for nausea or vomiting. 10/01/16   Prudencio Burly III, PA-C  ranitidine (ZANTAC) 150 MG tablet Take 150 mg by mouth 2 (two) times daily.    [provider]  valACYclovir (VALTREX) 500 MG tablet Take 500 mg by mouth 2 (two) times daily as needed (takes 1 tablet daily but 1 tablet twice daily  if having outbreak).  06/24/16   [provider]    Allergies    Contrast media [iodinated diagnostic agents], Erythromycin, Metrizamide, and Latex  Review of Systems   Review of Systems  All other systems reviewed and are negative.   Physical Exam Updated Vital Signs BP (!) 158/90 (BP Location: Right Arm)   Pulse 84   Temp 98.9 F (37.2 C) (Oral)   Resp (!) 22   Ht 5\' 8"  (1.727 m)   Wt 83 kg   SpO2 95%   BMI 27.83 kg/m   Physical Exam Vitals and nursing note reviewed.  Constitutional:      General: She is not in acute distress.    Appearance: She is well-developed.  HENT:     Head: Normocephalic and atraumatic.  Eyes:     Conjunctiva/sclera: Conjunctivae normal.  Cardiovascular:     Rate and Rhythm: Normal rate and regular rhythm.     Heart sounds: No murmur heard.   Pulmonary:     Effort: Pulmonary effort is normal. No respiratory distress.     Breath sounds: Normal breath sounds.  Abdominal:     Palpations: Abdomen is soft.     Tenderness: There is no abdominal tenderness.  Musculoskeletal:        General: Normal range of motion.     Cervical back: Neck supple.  Skin:    General: Skin is warm and dry.  Neurological:     Mental Status: She is alert and oriented to person, place, and time.  Psychiatric:        Mood and Affect: Mood normal.        Behavior: Behavior normal.     ED Results / Procedures / Treatments   Labs (all labs ordered are listed, but only abnormal results are displayed) Labs Reviewed  BASIC METABOLIC PANEL - Abnormal; Notable for the following components:      Result Value   Potassium 3.1 (*)      Glucose, Bld 108 (*)    All other components within normal limits  CBC - Abnormal; Notable for the following components:   WBC 418.4 (*)    RBC 3.41 (*)    Hemoglobin 9.4 (*)    HCT 32.6 (*)    MCHC 28.8 (*)    RDW 18.1 (*)    Platelets 147 (*)    nRBC 0.5 (*)  All other components within normal limits  TROPONIN I (HIGH SENSITIVITY) - Abnormal; Notable for the following components:   Troponin I (High Sensitivity) 35 (*)    All other components within normal limits  RESPIRATORY PANEL BY RT PCR (FLU A&B, COVID)  APTT  CBC WITH DIFFERENTIAL/PLATELET  HEPATIC FUNCTION PANEL  CD19 AND CD20, FLOW CYTOMETRY  LYMPHOCYTE SUBSETS, FLOW CYTOMETRY (INPT)  TROPONIN I (HIGH SENSITIVITY)    EKG EKG Interpretation  Date/Time:  Sunday November 14 2019 21:59:50 EDT Ventricular Rate:  74 PR Interval:  150 QRS Duration: 82 QT Interval:  396 QTC Calculation: 439 R Axis:   26 Text Interpretation: Normal sinus rhythm ST & T wave abnormality, consider inferior ischemia ST & T wave abnormality, consider anterolateral ischemia Abnormal ECG No previous tracing Confirmed by Orpah Greek [phone]) on 11/14/2019 11:14:45 PM   Radiology DG Chest 2 View  Result Date: 11/14/2019 CLINICAL DATA:  Shortness of breath EXAM: CHEST - 2 VIEW COMPARISON:  07/08/2019 FINDINGS: Heart and mediastinal contours are within normal limits. No focal opacities or effusions. No acute bony abnormality. IMPRESSION: No active cardiopulmonary disease. Electronically Signed   By: Rolm Baptise M.D.   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    Critical  care was time spent personally by me on the following activities:  Discussions with consultants, evaluation of patient's response to treatment, examination of patient, ordering and performing treatments and interventions, ordering and review of laboratory studies, ordering and review of radiographic studies, pulse oximetry, re-evaluation of patient's condition, obtaining history from patient or surrogate and review of old charts   (including critical care time)  Medications Ordered in ED Medications - No data to display  ED Course  I have reviewed the triage vital signs and the nursing notes.  Pertinent labs & imaging results that were available during my care of the patient were reviewed by me and considered in my medical decision making (see chart for details).    MDM Rules/Calculators/A&P                          This patient complains of SOB, this involves an extensive number of treatment options, and is a complaint that carries with it a high risk of complications and morbidity.    Differential Dx PE, ACS, pneumonia, covid  Pertinent Labs I ordered, reviewed, and interpreted labs, which included CBC notable for leukocytosis to 418, BMP shows K of 3.1, trop 35.  Imaging Interpretation I ordered imaging studies which included CXR.  I independently visualized and interpreted the CXR, which showed no obvious abnormality.   Medications I ordered medication allopurinol per hem/onc recs.  Sources Previous records obtained and reviewed recent outpatient diagnosis of DVT, on Xarelto   Critical Interventions  None  Reassessments After the interventions stated above, I reevaluated the patient and found stable.  Consultants Appreciate the following consultants: Dr. Jana Hakim - Will see patient in the morning. Dr. Marlowe Sax - [facility], who will admit.  Plan Admit     Final Clinical Impression(s) / ED Diagnoses Final diagnoses:  Shortness of breath    Rx / DC Orders ED  Discharge  Orders    None       Montine Circle, PA-C 11/15/19 0037    Orpah Greek, MD 11/15/19 0054    Montine Circle, PA-C 11/15/19 [id number]    Orpah Greek, MD 11/15/19 [phone]

## 2019-11-14 NOTE — ED Triage Notes (Signed)
The pt was diagnosed with a dvt in her rt leg this past Monday today she has been sob irregu;ar heart rate and her lt knee is hurting.  She is on zarelto

## 2019-11-14 NOTE — ED Notes (Signed)
ED Provider at bedside. 

## 2019-11-15 ENCOUNTER — Observation Stay (HOSPITAL_COMMUNITY): Payer: Medicare Other

## 2019-11-15 ENCOUNTER — Other Ambulatory Visit (HOSPITAL_COMMUNITY): Payer: Medicare Other

## 2019-11-15 DIAGNOSIS — D72829 Elevated white blood cell count, unspecified: Secondary | ICD-10-CM

## 2019-11-15 DIAGNOSIS — C9500 Acute leukemia of unspecified cell type not having achieved remission: Secondary | ICD-10-CM

## 2019-11-15 DIAGNOSIS — R0600 Dyspnea, unspecified: Secondary | ICD-10-CM

## 2019-11-15 DIAGNOSIS — R002 Palpitations: Secondary | ICD-10-CM

## 2019-11-15 DIAGNOSIS — R079 Chest pain, unspecified: Secondary | ICD-10-CM

## 2019-11-15 DIAGNOSIS — I82409 Acute embolism and thrombosis of unspecified deep veins of unspecified lower extremity: Secondary | ICD-10-CM

## 2019-11-15 LAB — RESPIRATORY PANEL BY RT PCR (FLU A&B, COVID)
Influenza A by PCR: NEGATIVE
Influenza B by PCR: NEGATIVE
SARS Coronavirus 2 by RT PCR: NEGATIVE

## 2019-11-15 LAB — BASIC METABOLIC PANEL
Anion gap: 11 (ref 5–15)
Anion gap: 7 (ref 5–15)
BUN: 18 mg/dL (ref 8–23)
BUN: 17 mg/dL (ref 8–23)
CO2: 28 mmol/L (ref 22–32)
CO2: 27 mmol/L (ref 22–32)
Calcium: 9.1 mg/dL (ref 8.9–10.3)
Calcium: 8.8 mg/dL — ABNORMAL LOW (ref 8.9–10.3)
Chloride: 102 mmol/L (ref 98–111)
Chloride: 104 mmol/L (ref 98–111)
Creatinine, Ser: 0.91 mg/dL (ref 0.44–1.00)
Creatinine, Ser: 0.92 mg/dL (ref 0.44–1.00)
GFR, Estimated: >60 mL/min (ref >60)
GFR, Estimated: >60 mL/min (ref >60)
Glucose, Bld: 111 mg/dL — ABNORMAL HIGH (ref 70–99)
Glucose, Bld: 105 mg/dL — ABNORMAL HIGH (ref 70–99)
Potassium: 2.6 mmol/L — CL (ref 3.5–5.1)
Potassium: 6.6 mmol/L (ref 3.5–5.1)
Sodium: 141 mmol/L (ref 135–145)
Sodium: 138 mmol/L (ref 135–145)

## 2019-11-15 LAB — RETICULOCYTES
Immature Retic Fract: 38.4 % — ABNORMAL HIGH (ref 2.3–15.9)
RBC.: 3.23 MIL/uL — ABNORMAL LOW (ref 3.87–5.11)
Retic Count, Absolute: 34.6 K/uL (ref 19.0–186.0)
Retic Ct Pct: 1.1 % (ref 0.4–3.1)

## 2019-11-15 LAB — CBC WITH DIFFERENTIAL/PLATELET
Abs Immature Granulocytes: 19.35 10*3/uL — ABNORMAL HIGH (ref 0.00–0.07)
Basophils Absolute: 1 10*3/uL — ABNORMAL HIGH (ref 0.0–0.1)
Basophils Relative: 0 %
Eosinophils Absolute: 0 10*3/uL (ref 0.0–0.5)
Eosinophils Relative: 0 %
HCT: 31.5 % — ABNORMAL LOW (ref 36.0–46.0)
Hemoglobin: 9.5 g/dL — ABNORMAL LOW (ref 12.0–15.0)
Immature Granulocytes: 5 %
Lymphocytes Relative: 6 %
Lymphs Abs: 26.2 10*3/uL — ABNORMAL HIGH (ref 0.7–4.0)
MCH: 28.7 pg (ref 26.0–34.0)
MCHC: 30.2 g/dL (ref 30.0–36.0)
MCV: 95.2 fL (ref 80.0–100.0)
Monocytes Absolute: 174.7 10*3/uL — ABNORMAL HIGH (ref 0.1–1.0)
Monocytes Relative: 42 %
Neutro Abs: 190.4 10*3/uL — ABNORMAL HIGH (ref 1.7–7.7)
Neutrophils Relative %: 47 %
Platelets: 143 10*3/uL — ABNORMAL LOW (ref 150–400)
RBC: 3.31 MIL/uL — ABNORMAL LOW (ref 3.87–5.11)
RDW: 18.1 % — ABNORMAL HIGH (ref 11.5–15.5)
WBC: 411.7 10*3/uL (ref 4.0–10.5)
nRBC: 0.3 % — ABNORMAL HIGH (ref 0.0–0.2)

## 2019-11-15 LAB — VITAMIN B12: Vitamin B-12: 600 pg/mL (ref 180–914)

## 2019-11-15 LAB — HEPATIC FUNCTION PANEL
ALT: 47 U/L — ABNORMAL HIGH (ref 0–44)
AST: 78 U/L — ABNORMAL HIGH (ref 15–41)
Albumin: 3.6 g/dL (ref 3.5–5.0)
Alkaline Phosphatase: 128 U/L — ABNORMAL HIGH (ref 38–126)
Bilirubin, Direct: 0.2 mg/dL (ref 0.0–0.2)
Indirect Bilirubin: 0.6 mg/dL (ref 0.3–0.9)
Total Bilirubin: 0.8 mg/dL (ref 0.3–1.2)
Total Protein: 7.1 g/dL (ref 6.5–8.1)

## 2019-11-15 LAB — PATHOLOGIST SMEAR REVIEW: Path Review: 11012021

## 2019-11-15 LAB — IRON AND TIBC
Iron: 164 ug/dL (ref 28–170)
Saturation Ratios: 54 % — ABNORMAL HIGH (ref 10.4–31.8)
TIBC: 301 ug/dL (ref 250–450)
UIBC: 137 ug/dL

## 2019-11-15 LAB — HIV ANTIBODY (ROUTINE TESTING W REFLEX): HIV Screen 4th Generation wRfx: NONREACTIVE

## 2019-11-15 LAB — MAGNESIUM: Magnesium: 2.2 mg/dL (ref 1.7–2.4)

## 2019-11-15 LAB — TSH: TSH: 1.833 u[IU]/mL (ref 0.350–4.500)

## 2019-11-15 LAB — FOLATE: Folate: 29.1 ng/mL (ref >5.9)

## 2019-11-15 LAB — TROPONIN I (HIGH SENSITIVITY)
Troponin I (High Sensitivity): 67 ng/L — ABNORMAL HIGH (ref <18)
Troponin I (High Sensitivity): 144 ng/L (ref <18)

## 2019-11-15 LAB — FERRITIN: Ferritin: 1035 ng/mL — ABNORMAL HIGH (ref 11–307)

## 2019-11-15 MED ORDER — ACETAMINOPHEN 500 MG PO TABS
1000.0000 mg | ORAL_TABLET | Freq: Once | ORAL | Status: AC
  Administered 2019-11-15: 1000 mg via ORAL
  Filled 2019-11-15: qty 2

## 2019-11-15 MED ORDER — POTASSIUM CHLORIDE CRYS ER 20 MEQ PO TBCR
40.0000 meq | EXTENDED_RELEASE_TABLET | Freq: Once | ORAL | Status: AC
  Administered 2019-11-15: 40 meq via ORAL
  Filled 2019-11-15: qty 2

## 2019-11-15 MED ORDER — ENOXAPARIN SODIUM 40 MG/0.4ML ~~LOC~~ SOLN: 40.0000 mg | SUBCUTANEOUS | Status: DC

## 2019-11-15 MED ORDER — RIVAROXABAN 15 MG PO TABS
15.0000 mg | ORAL_TABLET | Freq: Once | ORAL | Status: AC
  Administered 2019-11-15: 15 mg via ORAL
  Filled 2019-11-15: qty 1

## 2019-11-15 MED ORDER — RIVAROXABAN 15 MG PO TABS
15.0000 mg | ORAL_TABLET | Freq: Two times a day (BID) | ORAL | Status: DC
  Filled 2019-11-15: qty 1

## 2019-11-15 MED ORDER — SODIUM CHLORIDE 0.9 % IV SOLN: INTRAVENOUS | Status: DC

## 2019-11-15 NOTE — ED Provider Notes (Signed)
6:19 AM Patient currently in ED awaiting inpatient bed.  Received phone call that patient may need tx to [facility] per hem/onc for urgent leukapheresis.   Dr. Jana Hakim, hem/onc to review peripheral smears and will notify hospitalist if transfer is indeed needed.  Oncoming team made aware of potential need for transfer.   Montine Circle, PA-C 11/15/19 [id number]    Orpah Greek, MD 11/15/19 [phone]

## 2019-11-15 NOTE — Discharge Summary (Signed)
Physician Discharge Summary  Terri Thomas MRN:[id number] DOB: 11/28/1950  PCP: Hunter, Megan A, FNP  Admitted from: Home Discharged to: Patient was transferred from the [facility] ED to [facility] for further evaluation and management.  Although H&P was done, patient was not admitted to the hospital.  Admit date: 11/14/2019 Discharge date: 11/15/2019  Recommendations for Outpatient Follow-up:  Patient transferred to [facility] for further evaluation and management.    Home Health: N/A    Equipment/Devices: N/A    Discharge Condition: Guarded but stable.   Code Status: Full Code Diet recommendation: As prior.    Discharge Diagnoses:  Principal Problem:   Hyperleukocytosis Active Problems:   DVT (deep venous thrombosis) (HCC)   Dyspnea   Palpitations   Chest pain   Brief Summary: 69-year-old female with PMH of arthritis, GERD, HSV 1 infection, hypothyroidism, goblet cell metaplasia, viral meningitis, reported recurrent lung infections and problems with inflammation of her oral mucosa over the past year for which she was treated with several rounds of steroids, presented to the [facility] ED with complaints of dyspnea and palpitations.  She returned from a cruise to [place] 11/05/2019 and on the flight back from [place] she noted some bruising in the right lower extremity.  She brought this to the medical attention at [facility] and was found to have a clot although we do not have those records and patient does not know the exact vein involved.  She was started on rivaroxaban.  She subsequently developed "traveler's diarrhea" and was started on Cipro.  2 to 3 days later she noted some skin blotches which were attributed to allergy to the medication.  Thereby her antibiotics was changed and the diarrhea resolved.  On day of ED visit here, 11/14/2019, she felt dyspneic, was not able to walk with her  grandchildren trick-or-treating during hallowing.  She also reported 1 week history of severe drenching night sweats and believe that she has lost weight recently along with associated fatigue.  She reported intermittent very brief episodes of substernal chest pain which she described as a "twinge" and denied CAD history.  Patient's daughter who works as a cardiac nurse at  Hospital insisted that she come to the ED.  In the ED she was noted to have marked leukocytosis (WBC 418, confirmed by repeating for 11.7), hemoglobin 9.5 and platelets 143.  Oncologist Dr. Magrinat was consulted, kindly refer to his consult note for details.  His review of peripheral smear showed >20% blasts and he indicates that this does not look like CML in transformation.  He discussed with Dr. Ellis at [facility] and personally arranged transfer directly from the ED for further evaluation and management including leukapheresis (not available at this hospital) and definitive treatment.  He also recommended holding rivaroxaban (although she did receive a dose in the hospital on 11/15/2019 at 1:44 AM) and starting IV heparin as she will need placement of central line.  He indicated that she had an artifactually elevated potassium (cell lysis in tube) and potassium earlier was low at 2.6.  This obviously will need to be verified again at [facility].  Dr. Magrinat personally called me and advised me that he has already arranged the transfer and canceled admission here.  I briefly met patient in the ED while transport was already at bedside ready to transfer her to the OSH.  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    Dyspnea, palpitations, recently diagnosed right lower extremity DVT: Not tachycardic or hypoxic in the ED.  Hemodynamically stable.  Chest x-ray without acute cardiopulmonary disease.  Compliant with Xarelto PTA.  Management as noted above.  Low  index of suspicion for acute PE.  As per admitting MD, unable to order CTA chest to assess for PE due to high risk allergy to contrast (anaphylaxis).  May pursue VQ scan at [facility] he felt necessary.  Again as noted above, received a dose of Xarelto 15 mg on 11/15/2019 at 1:44 AM.  Chest pain/mild troponin elevation: Chest pain appeared atypical. HS Troponin has gradually increased from 35 > 67 > 144.  EKG showed normal sinus rhythm at 74 bpm, normal axis,?  ST depression in inferior leads and T wave inversion in inferior and lateral leads.  QTc 439 ms.  Consider further trending of troponins at OSH, 2D echo to evaluate LVEF and wall motion abnormalities.  Chest pain resolved.  VQ scan discussion as noted above  Normocytic anemia: Hemoglobin normal on labs done 3 years ago.  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  Follow-up at OSH.  Anemia panel as noted below.  Thrombocytopenia: Again likely related to acute leukemia  Mildly elevated LFTs: No GI symptoms reported.  May consider RUQ ultrasound.  Hypothyroid: Noted. Lab Results  Component Value Date   TSH 1.833 11/15/2019   Hypokalemia/hyperkalemia Please see discussion above.  Initial potassiums were 3.1 > 2.6.  Then abruptly up to 6.6, as noted by oncologist, likely related to hemolysis.  This will need to be repeated and verified at OSH.  Patient did receive a dose of potassium chloride 40 M EQ at 2:10 AM but unlikely that a single dose would have increased potassium from 2.6-6.6.   ED Course: Hemodynamically stable.  Not tachycardic or hypoxic.  WBC significantly elevated at 418.4, hemoglobin 9.4, hematocrit 32.6, MCV 95.6, platelet 147k.  Repeat CBC confirmed significant leukocytosis with WBC 411.7, differential pending.  Sodium 142, potassium 3.1, chloride 105, bicarb 24, BUN 18, creatinine 0.9, glucose 108.  AST 78, ALT 47, alk phos 128, T bili 0.8.  Initial high-sensitivity troponin 35, repeat pending.  EKG showing diffuse  T wave abnormality.  SARS-CoV-2 PCR test negative.  Influenza panel negative.  Chest x-ray showing no active cardiopulmonary disease.  Consultations:  Oncology  Procedures:  None   Discharge Instructions As noted above, patient was transferred directly from ED to [facility].   Allergies  Allergen Reactions  . Contrast Media [Iodinated Diagnostic Agents] Anaphylaxis  . Ciprofloxacin Other (See Comments)    Blotches on skin  . Erythromycin Hives  . Metrizamide Hives and Itching  . Latex Rash      Procedures/Studies: DG Chest 2 View  Result Date: 11/14/2019 CLINICAL DATA:  Shortness of breath EXAM: CHEST - 2 VIEW COMPARISON:  07/08/2019 FINDINGS: Heart and mediastinal contours are within normal limits. No focal opacities or effusions. No acute bony abnormality. IMPRESSION: No active cardiopulmonary disease. Electronically Signed   By: Kevin  Dover M.D.   On: 11/14/2019 22:34      Subjective: I briefly met patient in the ED while transport was at bedside getting ready to take her to OSH.  She stated that she felt well despite given the serious new diagnosis that she was informed of.  She reported mild headache earlier which resolved after Tylenol.  No chest pain or dyspnea.  Discharge Exam:  Vitals:   11/15/19 0815 11/15/19 0830 11/15/19 0845 11/15/19   0853  BP: (!) 130/105 115/72 121/68 121/68  Pulse: 80 76 78 84  Resp: 20 15 (!) 22 17  Temp:    99.2 F (37.3 C)  TempSrc:      SpO2: 94% 95% 93% 100%  Weight:      Height:        General: Pleasant middle-aged female, moderately built and nourished lying comfortably supine in bed.  Oral mucosa moist Cardiovascular: S1 & S2 heard, RRR, S1/S2 +. No murmurs, rubs, gallops or clicks. No JVD or pedal edema. Respiratory: Clear to auscultation without wheezing, rhonchi or crackles. No increased work of breathing. Abdominal:  Non distended, non tender & soft. No organomegaly or masses appreciated.  Normal bowel sounds heard. CNS: Alert and oriented. No focal deficits. Extremities: no edema, no cyanosis.  Had right leg stocking, knee-high.    The results of significant diagnostics from this hospitalization (including imaging, microbiology, ancillary and laboratory) are listed below for reference.     Microbiology: Recent Results (from the past 240 hour(s))  Respiratory Panel by RT PCR (Flu A&B, Covid) - Nasopharyngeal Swab     Status: None   Collection Time: 11/14/19 11:42 PM   Specimen: Nasopharyngeal Swab  Result Value Ref Range Status   SARS Coronavirus 2 by RT PCR NEGATIVE NEGATIVE Final    Comment: (NOTE) SARS-CoV-2 target nucleic acids are NOT DETECTED.  The SARS-CoV-2 RNA is generally detectable in upper respiratoy specimens during the acute phase of infection. The lowest concentration of SARS-CoV-2 viral copies this assay can detect is 131 copies/mL. A negative result does not preclude SARS-Cov-2 infection and should not be used as the sole basis for treatment or other patient management decisions. A negative result may occur with  improper specimen collection/handling, submission of specimen other than nasopharyngeal swab, presence of viral mutation(s) within the areas targeted by this assay, and inadequate number of viral copies (<131 copies/mL). A negative result must be combined with clinical observations, patient history, and epidemiological information. The expected result is Negative.  Fact Sheet for Patients:  PinkCheek.be  Fact Sheet for Healthcare Providers:  GravelBags.it  This test is no t yet approved or cleared by the [place] FDA and  has been authorized for detection and/or diagnosis of SARS-CoV-2 by FDA under an Emergency Use Authorization (EUA). This EUA will remain  in effect (meaning this test can be used) for the duration of the COVID-19 declaration under Section 564(b)(1) of the  Act, 21 U.S.C. section 360bbb-3(b)(1), unless the authorization is terminated or revoked sooner.     Influenza A by PCR NEGATIVE NEGATIVE Final   Influenza B by PCR NEGATIVE NEGATIVE Final    Comment: (NOTE) The Xpert Xpress SARS-CoV-2/FLU/RSV assay is intended as an aid in  the diagnosis of influenza from Nasopharyngeal swab specimens and  should not be used as a sole basis for treatment. Nasal washings and  aspirates are unacceptable for Xpert Xpress SARS-CoV-2/FLU/RSV  testing.  Fact Sheet for Patients: PinkCheek.be  Fact Sheet for Healthcare Providers: GravelBags.it  This test is not yet approved or cleared by the [place] FDA and  has been authorized for detection and/or diagnosis of SARS-CoV-2 by  FDA under an Emergency Use Authorization (EUA). This EUA will remain  in effect (meaning this test can be used) for the duration of the  Covid-19 declaration under Section 564(b)(1) of the Act, 21  U.S.C. section 360bbb-3(b)(1), unless the authorization is  terminated or revoked. Performed at [facility], [place]  [address] [place], [place] [address]      Labs: CBC: Recent Labs  Lab 11/14/19 2212 11/14/19 2357  WBC 418.4* 411.7*  NEUTROABS  --  190.4*  HGB 9.4* 9.5*  HCT 32.6* 31.5*  MCV 95.6 95.2  PLT 147* 143*    Basic Metabolic Panel: Recent Labs  Lab 11/14/19 2212 11/15/19 0236 11/15/19 0616  NA 142 141 138  K 3.1* 2.6* 6.6*  CL 105 102 104  CO2 _0 GLUCOSE 108* 111* 105*  BUN _1 CREATININE 0.90 0.91 0.92  CALCIUM 9.4 9.1 8.8*  MG  --  2.2  --     Liver Function Tests: Recent Labs  Lab 11/14/19 2357  AST 78*  ALT 47*  ALKPHOS 128*  BILITOT 0.8  PROT 7.1  ALBUMIN 3.6     Thyroid function studies Recent Labs    11/15/19 0236  TSH 1.833    Anemia work up Recent Labs    11/15/19 0236  VITAMINB12 600  FOLATE 29.1  FERRITIN 1,035*  TIBC 301  IRON 164   RETICCTPCT 1.1    Urinalysis No results found for: COLORURINE, APPEARANCEUR, LABSPEC, PHURINE, GLUCOSEU, HGBUR, BILIRUBINUR, KETONESUR, PROTEINUR, UROBILINOGEN, NITRITE, LEUKOCYTESUR   I discussed in detail with patient's daughter at bedside who came just about the time when they were getting ready to transfer her out.  Updated care and answered all questions.  Time coordinating discharge: 25 minutes  SIGNED:  Vernell Leep, MD, [place], [facility]. [facility]  To contact the attending provider between 7A-7P or the covering provider during after hours 7P-7A, please log into the web site [email or web address] and access using universal [place] password for that web site. If you do not have the password, please call the hospital operator.

## 2019-11-15 NOTE — ED Notes (Signed)
Pt report given to [facility] RN at [facility]

## 2019-11-15 NOTE — ED Notes (Signed)
Provider at bedside

## 2019-11-15 NOTE — ED Notes (Signed)
Patient took Tylenol with small sip of water, OK per [place], [place]

## 2019-11-15 NOTE — Progress Notes (Signed)
  Echocardiogram 2D Echocardiogram has been attempted. Echo canceled per MD.  Randa Lynn Kolten Ryback 11/15/2019, 8:50 AM

## 2019-11-15 NOTE — Progress Notes (Signed)
Update 5:45 AM: Received a call from Dr. Jana Hakim informing me that he will be reviewing the patient's peripheral blood smear soon.  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  She needs urgent transfer to [facility].  Dr. Jana Hakim stated he will review the patient's blood smear first and then speak to oncology at [facility].  He advised me to wait until he reviews the patient's blood smear.   Patient is still in the ED.  I have spoken to ED PA Montine Circle and made him aware of the situation.  I have requested help arranging urgent ED to ED transfer to [facility] after we hear back from Dr. Jana Hakim.

## 2019-11-15 NOTE — ED Notes (Signed)
Spoke with Dr. Marlowe Sax, oncologist is going to see patient in ED and patient will likely be transferred to [facility] is the next couple hours. ED charge RN [place] made aware of same.

## 2019-11-15 NOTE — ED Notes (Signed)
Patient made aware of need for stool specimen for occult blood card, verbalized understanding.

## 2019-11-15 NOTE — H&P (Signed)
History and Physical    Lynnley J Peed MRN:[id number] DOB: 12/27/1950 DOA: 11/14/2019  PCP: Hunter, Megan A, FNP Patient coming from: Home  Chief Complaint: Shortness of breath, palpitations  HPI: Terri Thomas is a 69 y.o. female with medical history significant of goblet cell metaplasia, history of viral meningitis, HSV 1 infection, GERD, hypothyroidism presenting to the ED with complaints of shortness of breath and palpitations.  Patient states about a week ago she was diagnosed with right lower leg DVT and started on Xarelto 15 mg twice daily, reports compliance.  For the past 1 day she has had dyspnea with exertion and palpitations.  She has had intermittent very brief episodes of substernal chest pain which she describes as "a twinge."  Denies history of CAD.  Patient states she has had recurrent lung infections and problems with inflammation of her oral mucosa over the past year and has been treated with several rounds of steroids.  She was recently diagnosed with traveler's diarrhea and started on Cipro which she was not able to tolerate due to a skin reaction and was switched to prednisone again.  States for the past 1 week she has had severe drenching night sweats and believes she has lost weight recently.  She has also had fatigue.  She has not noticed any bumps or swelling in her neck, armpits, or groin region.  ED Course: Hemodynamically stable.  Not tachycardic or hypoxic.  WBC significantly elevated at 418.4, hemoglobin 9.4, hematocrit 32.6, MCV 95.6, platelet 147k.  Repeat CBC confirmed significant leukocytosis with WBC 411.7, differential pending.  Sodium 142, potassium 3.1, chloride 105, bicarb 24, BUN 18, creatinine 0.9, glucose 108.  AST 78, ALT 47, alk phos 128, T bili 0.8.  Initial high-sensitivity troponin 35, repeat pending.  EKG showing diffuse T wave abnormality.  SARS-CoV-2 PCR test negative.  Influenza panel negative.  Chest x-ray showing no active cardiopulmonary  disease.  Patient received evening dose of her home Xarelto.  ED provider discussed the case with Dr. Magrinat who was concerned about patient possibly having CLL, oncology will consult in a.m.  CD19 and CD 20 flow cytometry ordered.  Pathologist smear review ordered.  Review of Systems:  All systems reviewed and apart from history of presenting illness, are negative.  Past Medical History:  Diagnosis Date  . Arthritis   . GERD (gastroesophageal reflux disease)   . HSV-1 (herpes simplex virus 1) infection   . Hypothyroidism   . Other abnormality of brain or central nervous system function study    Goblet cell metaplasia  . Thyroid disease   . Viral meningitis 2011    Past Surgical History:  Procedure Laterality Date  . APPENDECTOMY    . COLONOSCOPY    . ESOPHAGOGASTRODUODENOSCOPY    . MENISCUS REPAIR Left 12/2015  . THYROIDECTOMY    . TOTAL HIP ARTHROPLASTY Right 10/01/2016   Procedure: TOTAL HIP ARTHROPLASTY ANTERIOR APPROACH;  Surgeon: Murphy, Timothy D, MD;  Location: [facility] OR;  Service: Orthopedics;  Laterality: Right;     reports that she quit smoking about 35 years ago. She has a 5.00 pack-year smoking history. She has never used smokeless tobacco. She reports current alcohol use of about 1.0 standard drink of alcohol per week. She reports that she does not use drugs.  Allergies  Allergen Reactions  . Contrast Media [Iodinated Diagnostic Agents] Anaphylaxis  . Erythromycin Hives  . Metrizamide Hives and Itching  . Latex Rash    Family History  Problem Relation Age of   Onset  . Stroke Mother   . Prostate cancer Father     Prior to Admission medications   Medication Sig Start Date End Date Taking? Authorizing Provider  aspirin EC 325 MG tablet Take 1 tablet (325 mg total) by mouth daily. For 30 days post op for DVT Prophylaxis 10/01/16   Prudencio Burly III, PA-C  docusate sodium (COLACE) 100 MG capsule Take 1 capsule (100 mg total) by mouth 2 (two) times  daily. To prevent constipation while taking pain medication. 10/01/16   Prudencio Burly III, PA-C  FIBER ADULT GUMMIES PO Take 2 each by mouth daily.    [provider]  HYDROcodone-acetaminophen (NORCO) 5-325 MG tablet Take 1-2 tablets by mouth every 4 (four) hours as needed for moderate pain. 10/01/16   Martensen, Charna Elizabeth III, PA-C  levothyroxine (SYNTHROID, LEVOTHROID) 137 MCG tablet Take 137 mcg by mouth daily before breakfast.    [provider]  Menthol, Topical Analgesic, (BIOFREEZE EX) Apply 1 application topically daily as needed (pain).    [provider]  methocarbamol (ROBAXIN) 500 MG tablet Take 1 tablet (500 mg total) by mouth every 6 (six) hours as needed for muscle spasms. 10/01/16   Prudencio Burly III, PA-C  Multiple Vitamins-Minerals (CENTRUM SILVER PO) Take 1 tablet by mouth daily.    [provider]  ondansetron (ZOFRAN) 4 MG tablet Take 1 tablet (4 mg total) by mouth every 8 (eight) hours as needed for nausea or vomiting. 10/01/16   Prudencio Burly III, PA-C  ranitidine (ZANTAC) 150 MG tablet Take 150 mg by mouth 2 (two) times daily.    [provider]  valACYclovir (VALTREX) 500 MG tablet Take 500 mg by mouth 2 (two) times daily as needed (takes 1 tablet daily but 1 tablet twice daily  if having outbreak).  06/24/16   [provider]    Physical Exam: Vitals:   11/14/19 2255 11/14/19 2315 11/14/19 2345 11/15/19 0130  BP: (!) 158/90 (!) 145/82 (!) 153/89 120/63  Pulse: 84 70 74 65  Resp: (!) _0 (!) 22  Temp: 98.9 F (37.2 C)     TempSrc: Oral     SpO2: 95% 95% 95% 91%  Weight:      Height:        Physical Exam Constitutional:      General: She is not in acute distress. HENT:     Head: Normocephalic and atraumatic.  Eyes:     Extraocular Movements: Extraocular movements intact.     Conjunctiva/sclera: Conjunctivae normal.  Cardiovascular:     Rate and Rhythm: Normal rate and  regular rhythm.     Pulses: Normal pulses.  Pulmonary:     Effort: Pulmonary effort is normal. No respiratory distress.     Breath sounds: Normal breath sounds. No wheezing or rales.  Abdominal:     General: Bowel sounds are normal. There is no distension.     Palpations: Abdomen is soft.     Tenderness: There is no abdominal tenderness.  Musculoskeletal:        General: No swelling or tenderness.     Cervical back: Normal range of motion and neck supple.  Lymphadenopathy:     Cervical: No cervical adenopathy.  Skin:    General: Skin is warm and dry.     Comments: No cervical or axillary lymphadenopathy appreciated  Neurological:     General: No focal deficit present.     Mental Status: She is alert and  oriented to person, place, and time.     Labs on Admission: I have personally reviewed following labs and imaging studies  CBC: Recent Labs  Lab 11/14/19 2212 11/14/19 2357  WBC 418.4* 411.7*  NEUTROABS  --  190.4*  HGB 9.4* 9.5*  HCT 32.6* 31.5*  MCV 95.6 95.2  PLT 147* 338*   Basic Metabolic Panel: Recent Labs  Lab 11/14/19 2212  NA 142  K 3.1*  CL 105  CO2 24  GLUCOSE 108*  BUN 18  CREATININE 0.90  CALCIUM 9.4   GFR: Estimated Creatinine Clearance: 68.5 mL/min (by C-G formula based on SCr of 0.9 mg/dL). Liver Function Tests: Recent Labs  Lab 11/14/19 2357  AST 78*  ALT 47*  ALKPHOS 128*  BILITOT 0.8  PROT 7.1  ALBUMIN 3.6   No results for input(s): LIPASE, AMYLASE in the last 168 hours. No results for input(s): AMMONIA in the last 168 hours. Coagulation Profile: No results for input(s): INR, PROTIME in the last 168 hours. Cardiac Enzymes: No results for input(s): CKTOTAL, CKMB, CKMBINDEX, TROPONINI in the last 168 hours. BNP (last 3 results) No results for input(s): PROBNP in the last 8760 hours. HbA1C: No results for input(s): HGBA1C in the last 72 hours. CBG: No results for input(s): GLUCAP in the last 168 hours. Lipid Profile: No  results for input(s): CHOL, HDL, LDLCALC, TRIG, CHOLHDL, LDLDIRECT in the last 72 hours. Thyroid Function Tests: No results for input(s): TSH, T4TOTAL, FREET4, T3FREE, THYROIDAB in the last 72 hours. Anemia Panel: No results for input(s): VITAMINB12, FOLATE, FERRITIN, TIBC, IRON, RETICCTPCT in the last 72 hours. Urine analysis: No results found for: COLORURINE, APPEARANCEUR, [place], [place], [place], [place], BILIRUBINUR, KETONESUR, PROTEINUR, UROBILINOGEN, NITRITE, LEUKOCYTESUR  Radiological Exams on Admission: DG Chest 2 View  Result Date: 11/14/2019 CLINICAL DATA:  Shortness of breath EXAM: CHEST - 2 VIEW COMPARISON:  07/08/2019 FINDINGS: Heart and mediastinal contours are within normal limits. No focal opacities or effusions. No acute bony abnormality. IMPRESSION: No active cardiopulmonary disease. Electronically Signed   By: Rolm Baptise M.D.   On: 11/14/2019 22:34    EKG: Independently reviewed.  Sinus rhythm, diffuse T wave abnormality.  No prior tracing for comparison.  Assessment/Plan Principal Problem:   Hyperleukocytosis Active Problems:   DVT (deep venous thrombosis) (HCC)   Dyspnea   Palpitations   Chest pain   Hyperleukocytosis WBC count significantly elevated at 418.4 on initial CBC and 411.7 on repeat CBC.  Differential pending.  Infection or steroid use less likely to be causing leukocytosis to this degree.  There is concern for underlying hematologic malignancy/possibly CLL given presence of "B symptoms" such as severe drenching night sweats, weight loss, and fatigue.  She was diagnosed with a DVT a week ago. -Oncology will consult in a.m. CD19 and CD 20 flow cytometry ordered.  Pathologist smear review ordered.  Dyspnea, palpitations Recently diagnosed right lower extremity DVT Patient reports being recently diagnosed with right lower extremity DVT a week ago and was started on Xarelto 15 mg twice daily, reports compliance.  Presenting with complaints of dyspnea and  palpitations.  Not tachycardic or hypoxic in the ED.  Hemodynamically stable.  Chest x-ray showing no active cardiopulmonary disease. -Unable to order CT angiogram chest to assess for PE as patient has a high risk allergy to contrast (anaphylaxis).  She is already anticoagulated with Xarelto, continue.  VQ scan has been ordered.  Check TSH level.  Chest pain Mild troponin elevation Chest pain appears atypical.  High-sensitivity troponin  mildly elevated at 35.  EKG showing diffuse T wave abnormality but there is no prior tracing for comparison.  Patient is currently chest pain-free and appears comfortable. -Cardiac monitoring, trend troponin.  VQ scan ordered as mentioned above to rule out PE. Will also order echocardiogram.  Normocytic anemia Hemoglobin 9.4, hematocrit 32.6, MCV 95.6.  No recent labs for comparison.  Hemoglobin was normal on labs done 3 years ago.  No signs of active bleeding.  Anemia could possibly be related to underlying hematologic malignancy given hyperleukocytosis. -Anemia panel and FOBT ordered  Mild hypokalemia Potassium 3.1.  Likely related to recent diarrhea. -Replete potassium.  Check magnesium level and replete if low.  Continue to monitor electrolytes.  Mild elevation of LFTs AST 78, ALT 47, alk phos 128.  T bili normal at 0.8.  No prior labs for comparison.  No complaints of abdominal pain, nausea, or vomiting. -Right upper quadrant ultrasound  Hypothyroidism  -Resume home Synthroid after pharmacy med rec is done  DVT prophylaxis: Xarelto Code Status: Full code Family Communication: No family available at this time. Disposition Plan: Status is: Observation  The patient remains OBS appropriate and will d/c before 2 midnights.  Dispo: The patient is from: Home              Anticipated d/c is to: Home              Anticipated d/c date is: 2 days              Patient currently is not medically stable to d/c.  The medical decision making on this patient  was of high complexity and the patient is at high risk for clinical deterioration, therefore this is a level 3 visit.  Shela Leff MD [facility]  If 7PM-7AM, please contact night-coverage [email or web address]  11/15/2019, 2:23 AM

## 2019-11-15 NOTE — Progress Notes (Signed)
[place]  [phone]) [phone] [phone]) [phone]     ID: [name] DOB: 11/16/1950  MR#: [id number]  QQP#:[id number]  Patient Care Team: Terri Chalk, FNP as PCP - General (Nurse Practitioner) Terri Cruel, MD OTHER MD:  CHIEF COMPLAINT: acute leukemia  CURRENT TREATMENT: pending   HISTORY OF CURRENT ILLNESS: Terri Thomas returned from a cruise to [place] 11/05/2019. On the flight back from [place] she noted some bruising in the right LE. She brougth this to medical attention at [facility] and was found to have a clot (we do not have those records and patient does not know exact vein involved). She was started on rivaroxaban which she tolerated well.  Subsequently she developed "traveller's diarrhea" and was started on cipro. Two or three days later she noted some skin blotches which were attributed to allergy to the medication. Her Abx was changed and the diarrhea has resolved.  Through all this no CBC was obtained. Most recent CBC I can locate is from 09/17/2016 and it shows a WBC 3.3, Hb 13.1 and platelets 149K  On 11/14/2019 she felt a bit SOB--was not able to walk with her grandchildren trick or treating. The patient's daughter (a cardio nurse at [facility]) insisted Terri Thomas come to the ED. Here she was found to have a WBC 418.4, repeat 411.7. Hb 9.5, platelets 143.  My review of the peripheral blood film shows >20% blasts-- this does not look like CML in transformation (which is what I expected). Formal diff by pathology pending and should be ready after 8 AM  I have discussed the case w Dr Lissa Merlin at [facility] and she has accepted the patient for transfer to the leukemia service.  INTERVAL HISTORY: I met with Terri Thomas in her ED room; daughter Terri Thomas Investment banker, corporate) present   REVIEW OF SYSTEMS: Terri Thomas is asymptomatic at rest. When ambulating she gets SOB. She has had drenching sweats but no fever. Rash mentioned above has resolved. No diarrhea. Tells me she had  some mouth sores January 2021 and saw several allergists to evaluate. Remarkably no one obtained a CBC. She also has had some URIs which she attributed to her helping to take care of he grade school grandchildren. Denies unusual h/a, N/V, visual changes, balance issues or falls. No cough or phlegm, no pleurisy.  PAST MEDICAL HISTORY: Past Medical History:  Diagnosis Date  . Arthritis   . GERD (gastroesophageal reflux disease)   . HSV-1 (herpes simplex virus 1) infection   . Hypothyroidism   . Other abnormality of brain or central nervous system function study    Goblet cell metaplasia  . Thyroid disease   . Viral meningitis 2011    PAST SURGICAL HISTORY: Past Surgical History:  Procedure Laterality Date  . APPENDECTOMY    . COLONOSCOPY    . ESOPHAGOGASTRODUODENOSCOPY    . MENISCUS REPAIR Left 12/2015  . THYROIDECTOMY    . TOTAL HIP ARTHROPLASTY Right 10/01/2016   Procedure: TOTAL HIP ARTHROPLASTY ANTERIOR APPROACH;  Surgeon: Renette Butters, MD;  Location: [place];  Service: Orthopedics;  Laterality: Right;    FAMILY HISTORY Family History  Problem Relation Age of Onset  . Stroke Mother   . Prostate cancer Father     SOCIAL HISTORY:  Former Audiological scientist.Husband Terri Thomas is retired from Community education officer. It's just the two of them at home. Daughter Terri Thomas is an Therapist, sports in the [facility] [employer] [facility] cardiac unit. Daughter Terri Thomas is an Engineering geologist. The patient has 2 grandchildren. She is not a  church or Gaffer.    ADVANCED DIRECTIVES: in the absence of any documents to the contrary the patient's husband is her HCPOA   HEALTH MAINTENANCE: Social History   Tobacco Use  . Smoking status: Former Smoker    Packs/day: 0.50    Years: 10.00    Pack years: 5.00    Quit date: 09/14/1984    Years since quitting: 35.1  . Smokeless tobacco: Never Used  Vaping Use  . Vaping Use: Never used  Substance Use Topics  . Alcohol use: Yes    Alcohol/week: 1.0 standard drink    Types: 1  Glasses of wine per week    Comment: daily  . Drug use: No     Colonoscopy:  PAP:  Bone density:  Mammography:   Allergies  Allergen Reactions  . Contrast Media [Iodinated Diagnostic Agents] Anaphylaxis  . Erythromycin Hives  . Metrizamide Hives and Itching  . Latex Rash    No current facility-administered medications for this encounter.   Current Outpatient Medications  Medication Sig Dispense Refill  . aspirin EC 325 MG tablet Take 1 tablet (325 mg total) by mouth daily. For 30 days post op for DVT Prophylaxis 30 tablet 0  . docusate sodium (COLACE) 100 MG capsule Take 1 capsule (100 mg total) by mouth 2 (two) times daily. To prevent constipation while taking pain medication. 60 capsule 0  . FIBER ADULT GUMMIES PO Take 2 each by mouth daily.    Marland Kitchen HYDROcodone-acetaminophen (NORCO) 5-325 MG tablet Take 1-2 tablets by mouth every 4 (four) hours as needed for moderate pain. 60 tablet 0  . levothyroxine (SYNTHROID, LEVOTHROID) 137 MCG tablet Take 137 mcg by mouth daily before breakfast.    . Menthol, Topical Analgesic, (BIOFREEZE EX) Apply 1 application topically daily as needed (pain).    . methocarbamol (ROBAXIN) 500 MG tablet Take 1 tablet (500 mg total) by mouth every 6 (six) hours as needed for muscle spasms. 40 tablet 0  . Multiple Vitamins-Minerals (CENTRUM SILVER PO) Take 1 tablet by mouth daily.    . ondansetron (ZOFRAN) 4 MG tablet Take 1 tablet (4 mg total) by mouth every 8 (eight) hours as needed for nausea or vomiting. 40 tablet 0  . ranitidine (ZANTAC) 150 MG tablet Take 150 mg by mouth 2 (two) times daily.    . valACYclovir (VALTREX) 500 MG tablet Take 500 mg by mouth 2 (two) times daily as needed (takes 1 tablet daily but 1 tablet twice daily  if having outbreak).       OBJECTIVE: white woman examined in bed  Vitals:   11/15/19 0415 11/15/19 0445  BP: (!) 114/58 127/87  Pulse: 63 77  Resp: (!) 23 18  Temp:    SpO2: 92% 91%     Body mass index is 27.83 kg/m.     Wt Readings from Last 3 Encounters:  11/14/19 183 lb (83 kg)  10/01/16 195 lb (88.5 kg)  09/17/16 210 lb 6.4 oz (95.4 kg)      ECOG FS:2 - Symptomatic, <50% confined to bed  Ocular: Sclerae unicteric, pupils round and equal Lungs no rales or rhonchi Heart regular rate and rhythm Abd soft, nontender, no palpable splenomegaly Neuro: non-focal, well-oriented, appropriate affect Breasts: deferred   LAB RESULTS:  CMP     Component Value Date/Time   NA 141 11/15/2019 0236   K 2.6 (LL) 11/15/2019 0236   CL 102 11/15/2019 0236   CO2 28 11/15/2019 0236   GLUCOSE 111 (H) 11/15/2019  0236   BUN 18 11/15/2019 0236   CREATININE 0.91 11/15/2019 0236   CALCIUM 9.1 11/15/2019 0236   PROT 7.1 11/14/2019 2357   ALBUMIN 3.6 11/14/2019 2357   AST 78 (H) 11/14/2019 2357   ALT 47 (H) 11/14/2019 2357   ALKPHOS 128 (H) 11/14/2019 2357   BILITOT 0.8 11/14/2019 2357   GFRNONAA >60 11/15/2019 0236   GFRAA >60 09/17/2016 1409    No results found for: TOTALPROTELP, ALBUMINELP, A1GS, A2GS, BETS, BETA2SER, GAMS, MSPIKE, SPEI  No results found for: KPAFRELGTCHN, LAMBDASER, KAPLAMBRATIO  Lab Results  Component Value Date   WBC 411.7 (HH) 11/14/2019   NEUTROABS 190.4 (H) 11/14/2019   HGB 9.5 (L) 11/14/2019   HCT 31.5 (L) 11/14/2019   MCV 95.2 11/14/2019   PLT 143 (L) 11/14/2019    @LASTCHEMISTRY @  No results found for: LABCA2  No components found for: QIHKVQ259  No results for input(s): INR in the last 168 hours.  No results found for: LABCA2  No results found for: DGL875  No results found for: IEP329  No results found for: JJO841  No results found for: CA2729  No components found for: HGQUANT  No results found for: CEA1 / No results found for: CEA1   No results found for: AFPTUMOR  No results found for: CHROMOGRNA  No results found for: PSA1  Admission on 11/14/2019  Component Date Value Ref Range Status  . Sodium 11/14/2019 142  135 - 145 mmol/L Final  . Potassium  11/14/2019 3.1* 3.5 - 5.1 mmol/L Final  . Chloride 11/14/2019 105  98 - 111 mmol/L Final  . CO2 11/14/2019 24  22 - 32 mmol/L Final  . Glucose, Bld 11/14/2019 108* 70 - 99 mg/dL Final   Glucose reference range applies only to samples taken after fasting for at least 8 hours.  . BUN 11/14/2019 18  8 - 23 mg/dL Final  . Creatinine, Ser 11/14/2019 0.90  0.44 - 1.00 mg/dL Final  . Calcium 11/14/2019 9.4  8.9 - 10.3 mg/dL Final  . GFR, Estimated 11/14/2019 >60  >60 mL/min Final   Comment: (NOTE) Calculated using the CKD-EPI Creatinine Equation (2021)   . Anion gap 11/14/2019 13  5 - 15 Final   Performed at [facility] [address], [place], [place] [address]  . WBC 11/14/2019 418.4* 4.0 - 10.5 K/uL Corrected   Comment: This critical result has verified and been called to RN ARIEL COLLINS by Messan Houegnifio on 10 31 2021 at 2310, and has been read back.  REPEATED TO VERIFY WHITE COUNT CONFIRMED ON SMEAR CORRECTED ON 10/31 AT 2311: PREVIOUSLY REPORTED AS 418.4 This critical result has verified and been called to RN ARIEL COLLINS by Messan Houegnifio on 10 31 2021 at 2310, and has been read back.    Marland Kitchen RBC 11/14/2019 3.41* 3.87 - 5.11 MIL/uL Final  . Hemoglobin 11/14/2019 9.4* 12.0 - 15.0 g/dL Final  . HCT 11/14/2019 32.6* 36 - 46 % Final  . MCV 11/14/2019 95.6  80.0 - 100.0 fL Final  . MCH 11/14/2019 27.6  26.0 - 34.0 pg Final  . MCHC 11/14/2019 28.8* 30.0 - 36.0 g/dL Final  . RDW 11/14/2019 18.1* 11.5 - 15.5 % Final  . Platelets 11/14/2019 147* 150 - 400 K/uL Final  . nRBC 11/14/2019 0.5* 0.0 - 0.2 % Final   Performed at [facility], [place] [address], [place], [place] [address]  . Troponin I (High Sensitivity) 11/14/2019 35* <18 ng/L Final  Comment: (NOTE) Elevated high sensitivity troponin I (hsTnI) values and significant  changes across serial measurements may suggest ACS but many other  chronic and acute conditions are known to elevate hsTnI results.  Refer to the  "Links" section for chest pain algorithms and additional  guidance. Performed at [facility], [place] [address], [place], [place] [address]   . aPTT 11/14/2019 31  24 - 36 seconds Final   Performed at [facility], [place] [address], [place], [place] [address]  . SARS Coronavirus 2 by RT PCR 11/14/2019 NEGATIVE  NEGATIVE Final   Comment: (NOTE) SARS-CoV-2 target nucleic acids are NOT DETECTED.  The SARS-CoV-2 RNA is generally detectable in upper respiratoy specimens during the acute phase of infection. The lowest concentration of SARS-CoV-2 viral copies this assay can detect is 131 copies/mL. A negative result does not preclude SARS-Cov-2 infection and should not be used as the sole basis for treatment or other patient management decisions. A negative result may occur with  improper specimen collection/handling, submission of specimen other than nasopharyngeal swab, presence of viral mutation(s) within the areas targeted by this assay, and inadequate number of viral copies (<131 copies/mL). A negative result must be combined with clinical observations, patient history, and epidemiological information. The expected result is Negative.  Fact Sheet for Patients:  PinkCheek.be  Fact Sheet for Healthcare Providers:  GravelBags.it  This test is no                          t yet approved or cleared by the [place] FDA and  has been authorized for detection and/or diagnosis of SARS-CoV-2 by FDA under an Emergency Use Authorization (EUA). This EUA will remain  in effect (meaning this test can be used) for the duration of the COVID-19 declaration under Section 564(b)(1) of the Act, 21 U.S.C. section 360bbb-3(b)(1), unless the authorization is terminated or revoked sooner.    . Influenza A by PCR 11/14/2019 NEGATIVE  NEGATIVE Final  . Influenza B by PCR 11/14/2019 NEGATIVE  NEGATIVE Final   Comment: (NOTE) The Xpert  Xpress SARS-CoV-2/FLU/RSV assay is intended as an aid in  the diagnosis of influenza from Nasopharyngeal swab specimens and  should not be used as a sole basis for treatment. Nasal washings and  aspirates are unacceptable for Xpert Xpress SARS-CoV-2/FLU/RSV  testing.  Fact Sheet for Patients: PinkCheek.be  Fact Sheet for Healthcare Providers: GravelBags.it  This test is not yet approved or cleared by the [place] FDA and  has been authorized for detection and/or diagnosis of SARS-CoV-2 by  FDA under an Emergency Use Authorization (EUA). This EUA will remain  in effect (meaning this test can be used) for the duration of the  Covid-19 declaration under Section 564(b)(1) of the Act, 21  U.S.C. section 360bbb-3(b)(1), unless the authorization is  terminated or revoked. Performed at [facility], [place] [address], [place], [place] [address]   . WBC 11/14/2019 411.7* 4.0 - 10.5 K/uL Final   Comment: REPEATED TO VERIFY WHITE COUNT CONFIRMED ON SMEAR CRITICAL VALUE NOTED.  VALUE IS CONSISTENT WITH PREVIOUSLY REPORTED AND CALLED VALUE.   Marland Kitchen RBC 11/14/2019 3.31* 3.87 - 5.11 MIL/uL Final  . Hemoglobin 11/14/2019 9.5* 12.0 - 15.0 g/dL Final  . HCT 11/14/2019 31.5* 36 - 46 % Final  . MCV 11/14/2019 95.2  80.0 - 100.0 fL Final  . MCH 11/14/2019 28.7  26.0 - 34.0 pg Final  . MCHC 11/14/2019  30.2  30.0 - 36.0 g/dL Final  . RDW 11/14/2019 18.1* 11.5 - 15.5 % Final  . Platelets 11/14/2019 143* 150 - 400 K/uL Final   Comment: REPEATED TO VERIFY PLATELET COUNT CONFIRMED BY SMEAR   . nRBC 11/14/2019 0.3* 0.0 - 0.2 % Final  . Neutrophils Relative % 11/14/2019 47  % Final  . Neutro Abs 11/14/2019 190.4* 1.7 - 7.7 K/uL Final  . Lymphocytes Relative 11/14/2019 6  % Final  . Lymphs Abs 11/14/2019 26.2* 0.7 - 4.0 K/uL Final  . Monocytes Relative 11/14/2019 42  % Final  . Monocytes Absolute 11/14/2019 174.7* 0.1 - 1.0 K/uL Final  .  Eosinophils Relative 11/14/2019 0  % Final  . Eosinophils Absolute 11/14/2019 0.0  0.0 - 0.5 K/uL Final  . Basophils Relative 11/14/2019 0  % Final  . Basophils Absolute 11/14/2019 1.0* 0.0 - 0.1 K/uL Final  . Immature Granulocytes 11/14/2019 5  % Final  . Abs Immature Granulocytes 11/14/2019 19.35* 0.00 - 0.07 K/uL Final   Performed at [facility], [place] [address] [place], [place] [address]  . Total Protein 11/14/2019 7.1  6.5 - 8.1 g/dL Final  . Albumin 11/14/2019 3.6  3.5 - 5.0 g/dL Final  . AST 11/14/2019 78* 15 - 41 U/L Final  . ALT 11/14/2019 47* 0 - 44 U/L Final  . Alkaline Phosphatase 11/14/2019 128* 38 - 126 U/L Final  . Total Bilirubin 11/14/2019 0.8  0.3 - 1.2 mg/dL Final  . Bilirubin, Direct 11/14/2019 0.2  0.0 - 0.2 mg/dL Final  . Indirect Bilirubin 11/14/2019 0.6  0.3 - 0.9 mg/dL Final   Performed at [facility] [address] [place], [place] [address]  . Vitamin B-12 11/15/2019 600  180 - 914 pg/mL Final   Comment: (NOTE) This assay is not validated for testing neonatal or myeloproliferative syndrome specimens for Vitamin B12 levels. Performed at [facility], [place] [address], [place], [place] [address]   . Iron 11/15/2019 164  28 - 170 ug/dL Final  . TIBC 11/15/2019 301  250 - 450 ug/dL Final  . Saturation Ratios 11/15/2019 54* 10.4 - 31.8 % Final  . UIBC 11/15/2019 137  ug/dL Final   Performed at [facility], [place] [address], [place], [place] [address]  . Ferritin 11/15/2019 1,035* 11 - 307 ng/mL Final   Performed at [facility], [place] [address] [place], [place] [address]  . Magnesium 11/15/2019 2.2  1.7 - 2.4 mg/dL Final   Performed at [facility] [address], [place], [place] [address]  . Sodium 11/15/2019 141  135 - 145 mmol/L Final  . Potassium 11/15/2019 2.6* 3.5 - 5.1 mmol/L Final   Comment: CRITICAL RESULT CALLED TO, READ BACK BY AND VERIFIED WITH: Hale Drone RN [id number] Van   . Chloride  11/15/2019 102  98 - 111 mmol/L Final  . CO2 11/15/2019 28  22 - 32 mmol/L Final  . Glucose, Bld 11/15/2019 111* 70 - 99 mg/dL Final   Glucose reference range applies only to samples taken after fasting for at least 8 hours.  . BUN 11/15/2019 18  8 - 23 mg/dL Final  . Creatinine, Ser 11/15/2019 0.91  0.44 - 1.00 mg/dL Final  . Calcium 11/15/2019 9.1  8.9 - 10.3 mg/dL Final  . GFR, Estimated 11/15/2019 >60  >60 mL/min Final   Comment: (NOTE) Calculated using the CKD-EPI Creatinine Equation (2021)   . Anion gap 11/15/2019  11  5 - 15 Final   Performed at [facility], [place] [address], [place], [place] [address]  . Folate 11/15/2019 29.1  >5.9 ng/mL Final   Performed at [facility], [place] [address] [place], [place] [address]  . Retic Ct Pct 11/15/2019 1.1  0.4 - 3.1 % Final  . RBC. 11/15/2019 3.23* 3.87 - 5.11 MIL/uL Final  . Retic Count, Absolute 11/15/2019 34.6  19.0 - 186.0 K/uL Final  . Immature Retic Fract 11/15/2019 38.4* 2.3 - 15.9 % Final   Performed at [facility], [place] [address], [place], [place] [address]  . Troponin I (High Sensitivity) 11/15/2019 67* <18 ng/L Final   Comment: (NOTE) Elevated high sensitivity troponin I (hsTnI) values and significant  changes across serial measurements may suggest ACS but many other  chronic and acute conditions are known to elevate hsTnI results.  Refer to the Links section for chest pain algorithms and additional  guidance. Performed at [facility], [place] [address], [place],  [address]   . TSH 11/15/2019 1.833  0.350 - 4.500 uIU/mL Final   Comment: Performed by a 3rd Generation assay with a functional sensitivity of <=0.01 uIU/mL. Performed at [facility], [place] [address] [place],  [address]     (this displays the last labs from the last 3 days)  No results found for: TOTALPROTELP, ALBUMINELP, A1GS, A2GS, BETS, BETA2SER, GAMS, MSPIKE, SPEI (this displays SPEP labs)  No results found for:  KPAFRELGTCHN, LAMBDASER, KAPLAMBRATIO (kappa/lambda light chains)  No results found for: HGBA, HGBA2QUANT, HGBFQUANT, HGBSQUAN (Hemoglobinopathy evaluation)   No results found for: LDH  Lab Results  Component Value Date   IRON 164 11/15/2019   TIBC 301 11/15/2019   IRONPCTSAT 54 (H) 11/15/2019   (Iron and TIBC)  Lab Results  Component Value Date   FERRITIN 1,035 (H) 11/15/2019    Urinalysis No results found for: COLORURINE, APPEARANCEUR, LABSPEC, PHURINE, GLUCOSEU, HGBUR, BILIRUBINUR, KETONESUR, PROTEINUR, UROBILINOGEN, NITRITE, LEUKOCYTESUR   STUDIES: DG Chest 2 View  Result Date: 11/14/2019 CLINICAL DATA:  Shortness of breath EXAM: CHEST - 2 VIEW COMPARISON:  07/08/2019 FINDINGS: Heart and mediastinal contours are within normal limits. No focal opacities or effusions. No acute bony abnormality. IMPRESSION: No active cardiopulmonary disease. Electronically Signed   By: Rolm Baptise M.D.   On: 11/14/2019 22:34    ELIGIBLE FOR AVAILABLE RESEARCH PROTOCOL:   ASSESSMENT: 69 y.o. [place] woman presenting to the ED 11/14/2019 with SOB and palpitations, found to have a WBC count of 418.4, confirmed on repeat, review of blood film consistent with acute leukemia  (1) recommend transfer to tertiary care facility for leukopheresis and definitive treatment  (2) history of RLE DVT: started rivaroxaban 11/06/2019  (a) hold rivaroxaban (last dose PM 11/14/2019  (b) start IV heparin  PLAN: I discussed the situation with Ailin and let her know her working diagnosis. I explained she needs leukopheresis which we do not do here and definitive treatment of her acute leukemia. She is agreeable to transfer and I have discussed the case w Dr Lissa Merlin at [facility]. She has agreed to accept the patient and transfer is being arranged.  In the meantime we are holding rivaroxaban this AM as the pt will need placement of central line-- last dose was last night (11/14/2019). She had an artefactually  elevated K+ (cell lysis in tube); earlier K+ today was actually low.  Please let me know if I can be of further  help.  Terri Cruel, MD   11/15/2019 6:51 AM Medical Oncology and Hematology [facility] [address] [place], [place] [address] Tel. [phone]    Fax. [phone]

## 2019-11-15 NOTE — ED Notes (Addendum)
Patient made aware of NPO status for morning testing, verbalized understanding of same.

## 2019-11-15 NOTE — ED Notes (Signed)
Date and time results received: 11/15/19 3:35 AM  Test: Potassium Critical Value: 2.6  Name of Provider Notified: Dr. Marlowe Sax  Orders Received? Or Actions Taken? BMP to be repeated in AM

## 2019-11-15 NOTE — ED Notes (Signed)
Patient accepted at [facility] per hemologist. Patient status remains ED patient, not admitted. Provider also made aware of potassium level, no further orders at this time.

## 2019-11-18 LAB — CD19 AND CD20, FLOW CYTOMETRY

## 2019-11-22 ENCOUNTER — Other Ambulatory Visit: Payer: Self-pay | Admitting: Oncology
